# Patient Record
Sex: Female | Born: 1960
Health system: Southern US, Community
[De-identification: ages and names within clinical notes are randomized; demographics above are authoritative.]

## PROBLEM LIST (undated history)

## (undated) DIAGNOSIS — E039 Hypothyroidism, unspecified: Secondary | ICD-10-CM

## (undated) DIAGNOSIS — R0602 Shortness of breath: Secondary | ICD-10-CM

## (undated) HISTORY — DX: Shortness of breath: R06.02

## (undated) HISTORY — DX: Hypothyroidism, unspecified: E03.9

## (undated) HISTORY — PX: ABDOMINAL HYSTERECTOMY: SHX81

## (undated) HISTORY — PX: ECTOPIC PREGNANCY SURGERY: SHX613

---

## 2006-04-19 ENCOUNTER — Encounter: Admission: RE | Admit: 2006-04-19 | Discharge: 2006-04-19 | Payer: Self-pay | Admitting: Obstetrics and Gynecology

## 2006-04-25 ENCOUNTER — Encounter: Admission: RE | Admit: 2006-04-25 | Discharge: 2006-04-25 | Payer: Self-pay | Admitting: Otolaryngology

## 2006-05-13 ENCOUNTER — Encounter (INDEPENDENT_AMBULATORY_CARE_PROVIDER_SITE_OTHER): Payer: Self-pay | Admitting: Specialist

## 2006-05-13 ENCOUNTER — Encounter: Admission: RE | Admit: 2006-05-13 | Discharge: 2006-05-13 | Payer: Self-pay | Admitting: Otolaryngology

## 2006-05-13 ENCOUNTER — Other Ambulatory Visit: Admission: RE | Admit: 2006-05-13 | Discharge: 2006-05-13 | Payer: Self-pay | Admitting: Interventional Radiology

## 2006-07-04 ENCOUNTER — Encounter: Admission: RE | Admit: 2006-07-04 | Discharge: 2006-07-04 | Payer: Self-pay | Admitting: Endocrinology

## 2006-09-18 ENCOUNTER — Ambulatory Visit (HOSPITAL_COMMUNITY): Admission: RE | Admit: 2006-09-18 | Discharge: 2006-09-19 | Payer: Self-pay | Admitting: General Surgery

## 2006-09-18 ENCOUNTER — Encounter (HOSPITAL_BASED_OUTPATIENT_CLINIC_OR_DEPARTMENT_OTHER): Payer: Self-pay | Admitting: General Surgery

## 2008-07-06 IMAGING — US US SOFT TISSUE HEAD/NECK
1 series · 14 of 25 positions shown · non-contrast
Comparison: none

CLINICAL DATA: Left throat mass.  Fullness of the left thyroid. 
 ULTRASOUND SOFT TISSUE HEAD/NECK:
TECHNIQUE: Ultrasound examination of the soft tissues was performed in the area of clinical concern.

[Series 1: unknown · 0.10mm/px · 14 of 39 slices shown]
[im 1/39]
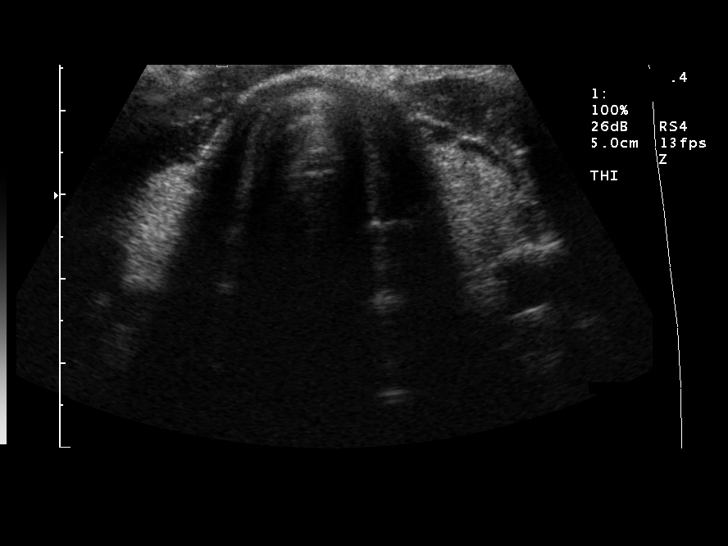
[im 4/39]
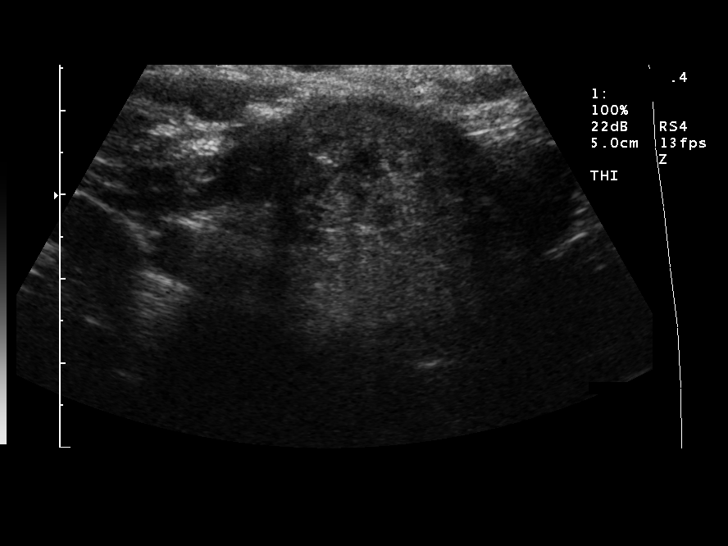
[im 7/39]
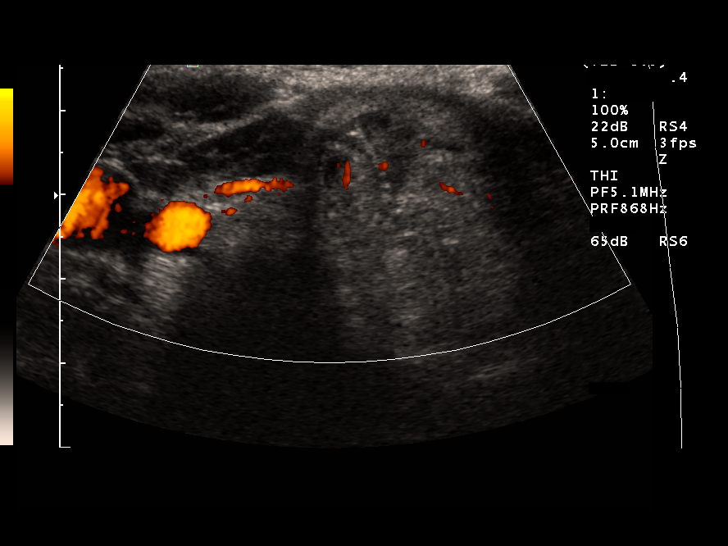
[im 10/39]
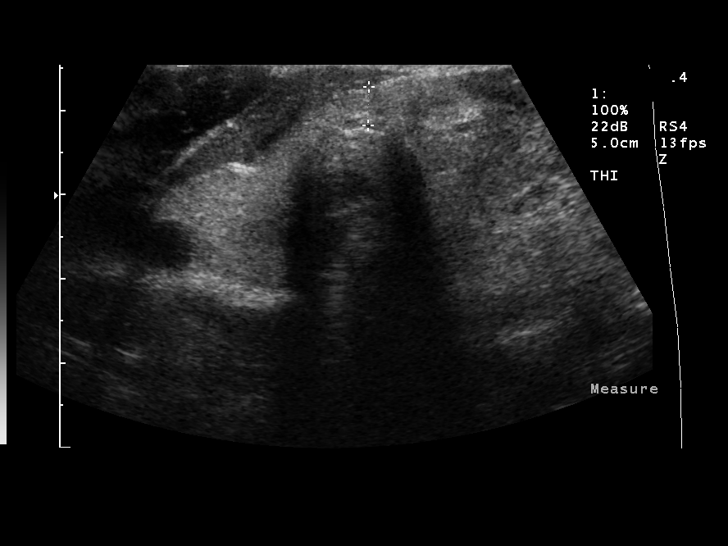
[im 13/39]
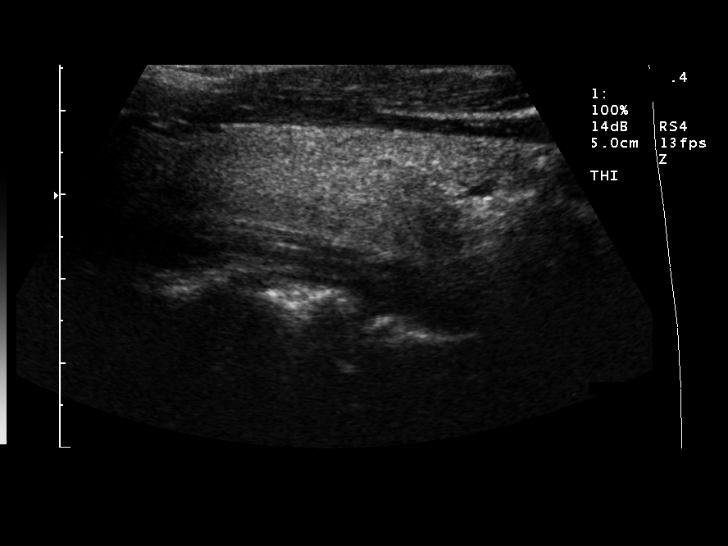
[im 15/39]
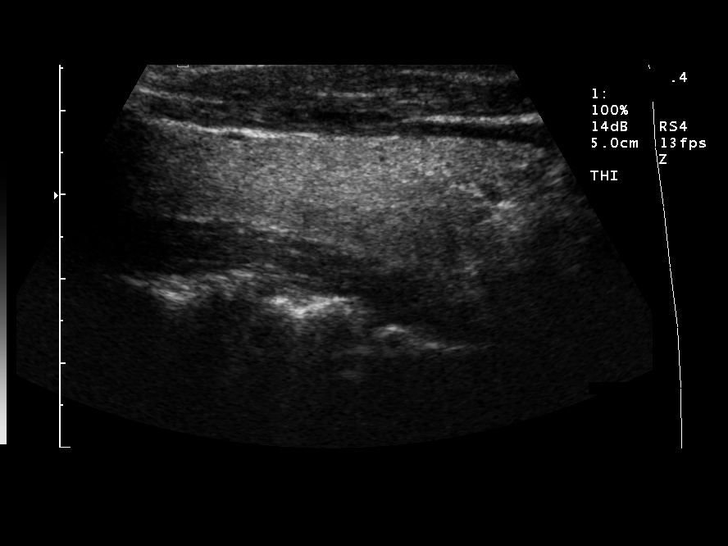
[im 18/39]
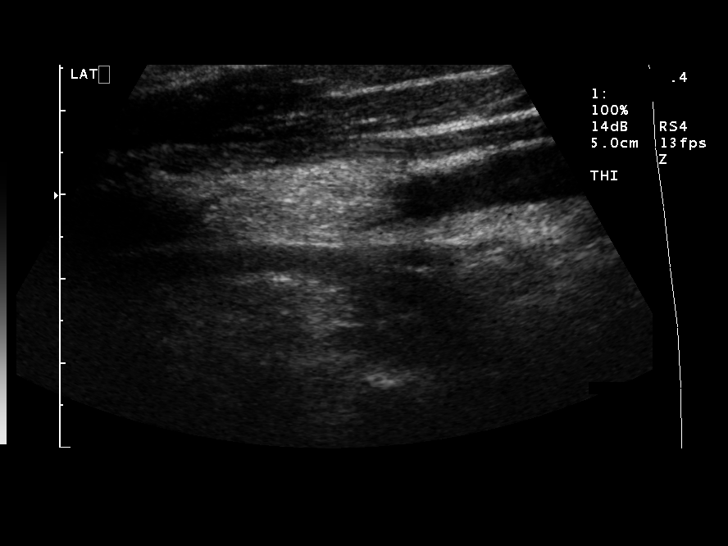
[im 21/39]
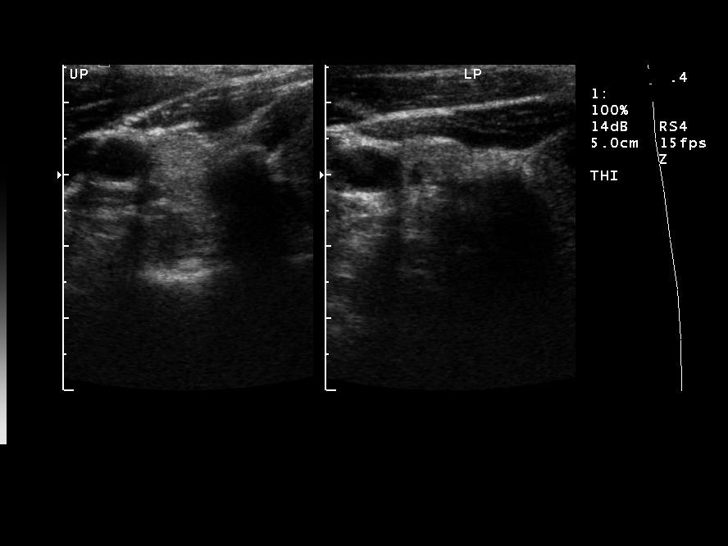
[im 24/39]
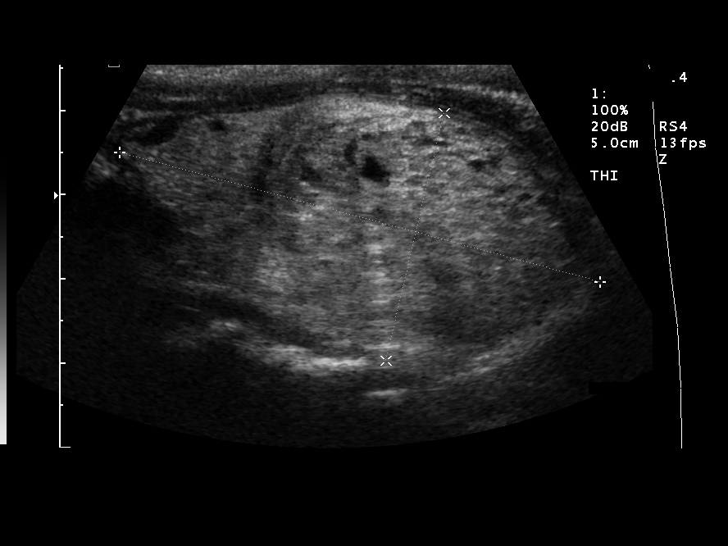
[im 26/39]
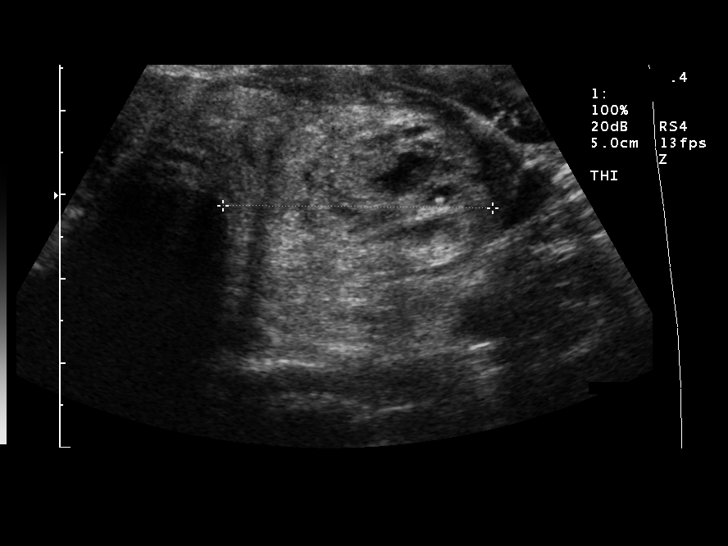
[im 29/39]
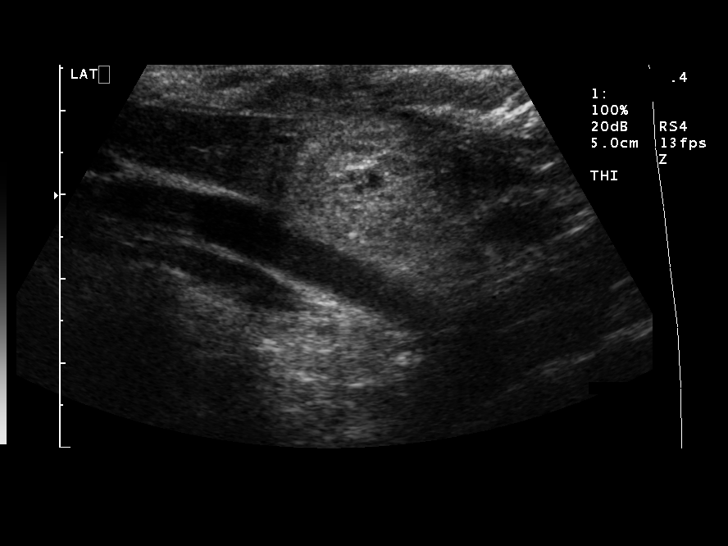
[im 32/39]
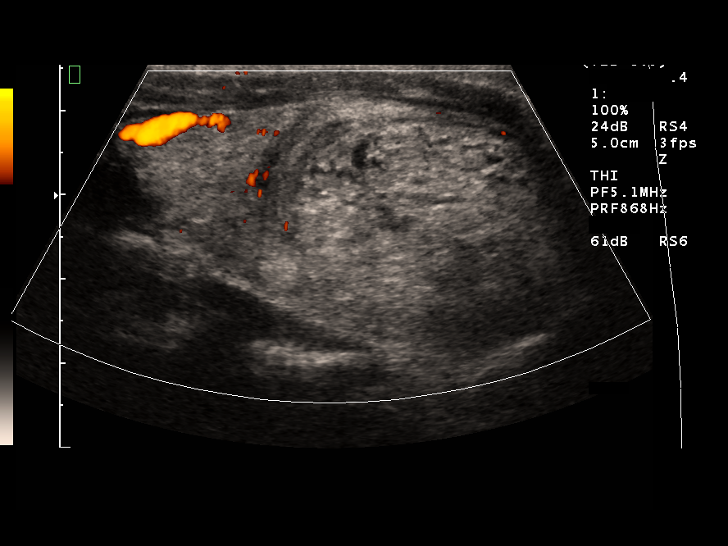
[im 35/39]
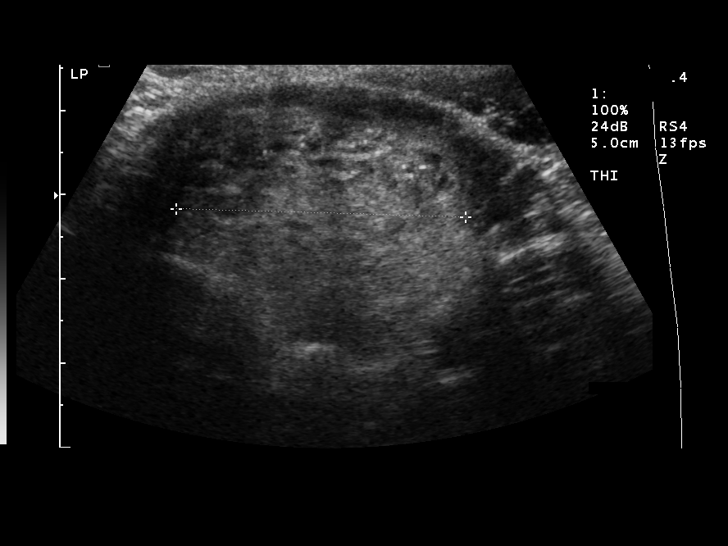
[im 39/39]
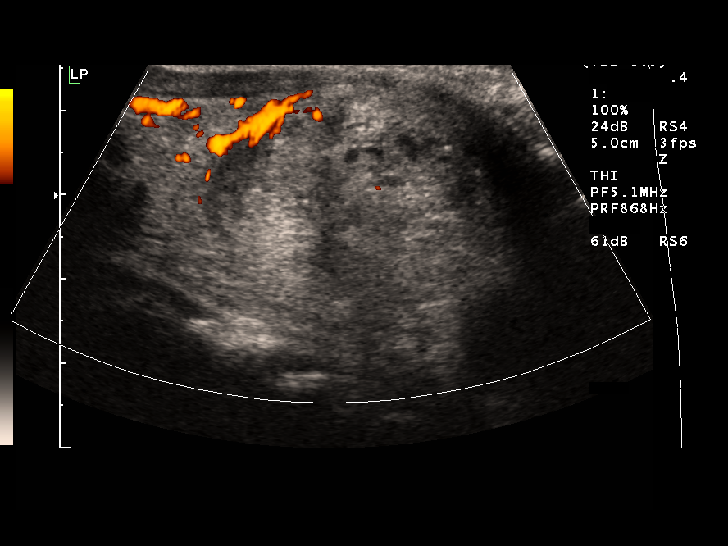

[14 of 25 positions shown; findings below may reference images not displayed]

FINDINGS: The right lobe of thyroid is normal in size measuring 5.2cm sagittally with a depth of 1.7cm and width of 1.8cm.  The left lobe is enlarged primarily as a result of a solid dominant mass.  The left lobe measures 5.9 x 3.0 x 3.2cm with the isthmus measuring 5mm.  This solid dominant mass is in the left mid to lower pole measuring 4.0 x 2.7 x 3.7cm.  No other solid or cystic lesions are noted.  Biopsy of this dominant mass is recommended.
IMPRESSION: Solid dominant mass in the left lobe of thyroid of 4.0 x 2.7 x 3.7cm.  Recommend biopsy.

## 2008-09-03 ENCOUNTER — Ambulatory Visit (HOSPITAL_COMMUNITY): Admission: RE | Admit: 2008-09-03 | Discharge: 2008-09-03 | Payer: Self-pay | Admitting: Gastroenterology

## 2008-10-13 ENCOUNTER — Encounter: Admission: RE | Admit: 2008-10-13 | Discharge: 2008-10-13 | Payer: Self-pay | Admitting: Obstetrics and Gynecology

## 2010-04-23 ENCOUNTER — Encounter: Payer: Self-pay | Admitting: Obstetrics and Gynecology

## 2010-04-24 ENCOUNTER — Encounter: Payer: Self-pay | Admitting: Obstetrics and Gynecology

## 2010-04-24 ENCOUNTER — Encounter: Payer: Self-pay | Admitting: Gastroenterology

## 2010-06-05 ENCOUNTER — Other Ambulatory Visit: Payer: Self-pay | Admitting: Obstetrics and Gynecology

## 2010-06-05 DIAGNOSIS — Z1231 Encounter for screening mammogram for malignant neoplasm of breast: Secondary | ICD-10-CM

## 2010-06-15 ENCOUNTER — Ambulatory Visit: Payer: Self-pay

## 2010-06-23 ENCOUNTER — Inpatient Hospital Stay: Admission: RE | Admit: 2010-06-23 | Payer: Self-pay | Source: Ambulatory Visit

## 2010-06-29 ENCOUNTER — Ambulatory Visit: Payer: Self-pay | Admitting: *Deleted

## 2010-07-04 ENCOUNTER — Ambulatory Visit
Admission: RE | Admit: 2010-07-04 | Discharge: 2010-07-04 | Disposition: A | Discharge status: Home / Self Care | Payer: Commercial Indemnity | Source: Ambulatory Visit | Attending: Obstetrics and Gynecology | Admitting: Obstetrics and Gynecology

## 2010-07-04 DIAGNOSIS — Z1231 Encounter for screening mammogram for malignant neoplasm of breast: Secondary | ICD-10-CM

## 2010-08-15 NOTE — Op Note (Signed)
NAMECURTIS, Pamela Roberts              ACCOUNT NO.:  [id number]   MEDICAL RECORD NO.:  [id number]          PATIENT TYPE:  OIB   LOCATION:  1607                         FACILITY:  [facility]   PHYSICIAN:  Leonie Man, M.D.   DATE OF BIRTH:  June 19, 1960   DATE OF PROCEDURE:  09/18/2006  DATE OF DISCHARGE:                               OPERATIVE REPORT   PREOPERATIVE DIAGNOSIS:  Cold nodule, left thyroid lobe.   POSTOPERATIVE DIAGNOSIS:  Cold nodule, left thyroid lobe, pathology  pending.   PROCEDURES:  Left thyroid lobectomy and isthmusectomy.   SURGEON:  Leonie Man, M.D.   ASSISTANT:  Dr. Lorelee New.   ANESTHESIA:  General.   SPECIMENS TO LAB:  Left thyroid lobe and isthmus.   ESTIMATED BLOOD LOSS:  Minimal.   COMPLICATIONS:  None.  The patient returned to the PACU in excellent  condition.   INDICATIONS:  The patient is a 50 year old female with a nodule of the  left neck.  When she presented originally she underwent fine-needle  aspiration biopsy under ultrasound guidance which showed a 4 cm x 3.7 cm  solid nodule.  This was confirmed on nuclear medicine scan, the  aspiration biopsy shows a follicular lesion with few microfollicles and  some Hurthle cell features.  The patient was referred for thyroid  lobectomy with a possibility of total thyroidectomy if indeed this is a  carcinoma.   The risks and potential benefits of surgery have been fully discussed  with the patient.  These include the risk of hypoparathyroidism as well  as recurrent laryngeal nerve injury.  She understands these risks and  gives her consent to same.   PROCEDURE:  Following induction of satisfactory general anesthesia with  the patient positioned supinely, the neck was prepped and draped to be  included in a sterile operative field.  A collar incision approximately  two fingerbreadths above the sternal notch was carried out through skin  and subcutaneous tissues, deepened through to the  platysma muscle.  A  superior flap was raised up to the thyroid cartilage and inferior flap  carried down to the sternal notch.  The strap muscles are then opened up  in the midline with dissection carried down on the left side, mobilizing  a large left-sided thyroid lobe.  Dissection was carried first up to the  upper pole where the upper pole was dissected free, secured with ties of  2-0 silk and medium clips and then transected using the harmonic focus  scalpel.  The superior parathyroid gland was identified and dissected  free from off of the substance of the thyroid as well as the inferior  pole parathyroid.  Both were clearly seen.  The recurrent laryngeal  nerve was also clearly seen and avoided throughout the entire course of  the dissection.  Dissection was carried down maintaining hemostasis  throughout the entire course of dissection until we crossed the midline.  The thyroid isthmus and pyramidal lobe were also dissected free and the  thyroid isthmus was transected using the Harmonic Focus scalpel.  All  areas dissection were then checked for hemostasis.  The  specimen was  forwarded for pathologic evaluation.  Frozen section diagnosis shows  that this was a follicular lesion with some microfollicular areas  without any areas of atypia noted.  At this point it was decided that we  would end the operation pending permanent pathology.  Again all areas of  dissection were checked for hemostasis.  The neck was irrigated with  normal saline.  I used Gelfoam patches to place over all the areas of  dissection.  Sponge and instrument counts were verified.  The midline  was closed with running 3-0 Vicryl suture.  The platysma muscle and  subcutaneous tissues closed with interrupted 3-0 Vicryl sutures and skin  closed with 5-0 Monocryl suture.  The incision was reinforced with a  single Steri-Strip and then a sterile dressing was applied.  The  anesthetic reversed and the patient removed  from the operating room to  the recovery room in stable condition.  She tolerated the procedure  well.      Leonie Man, M.D.  Electronically Signed     PB/MEDQ  D:  09/18/2006  T:  09/19/2006  Job:  [id number]   cc:   Dorisann Frames, M.D.  Fax: [phone]

## 2011-01-17 LAB — DIFFERENTIAL
Basophils Absolute: 0
Basophils Relative: 0
Eosinophils Absolute: 0.1
Eosinophils Relative: 1
Lymphocytes Relative: 28
Lymphs Abs: 2.5
Monocytes Relative: 9

## 2011-01-17 LAB — COMPREHENSIVE METABOLIC PANEL
ALT: 20
Albumin: 3.5
Alkaline Phosphatase: 73
CO2: 28
CO2: 30
Calcium: 8.5
Calcium: 9.5
Chloride: 102
Chloride: 105
Creatinine, Ser: 0.83
GFR calc Af Amer: >60
GFR calc non Af Amer: >60
Glucose, Bld: 133 — ABNORMAL HIGH
Sodium: 141

## 2011-01-17 LAB — URINALYSIS, ROUTINE W REFLEX MICROSCOPIC
Glucose, UA: NEGATIVE
Ketones, ur: NEGATIVE
Nitrite: NEGATIVE
Protein, ur: NEGATIVE
Urobilinogen, UA: 0.2

## 2011-01-17 LAB — CBC: HCT: 39.2

## 2011-01-17 LAB — PROTIME-INR
INR: 0.9
Prothrombin Time: 12.7

## 2011-04-06 ENCOUNTER — Emergency Department (HOSPITAL_COMMUNITY)
Admission: EM | Admit: 2011-04-06 | Discharge: 2011-04-07 | Disposition: A | Discharge status: Home / Self Care | Payer: Commercial Indemnity | Attending: Emergency Medicine | Admitting: Emergency Medicine

## 2011-04-06 ENCOUNTER — Encounter: Payer: Self-pay | Admitting: Emergency Medicine

## 2011-04-06 ENCOUNTER — Other Ambulatory Visit: Payer: Self-pay

## 2011-04-06 DIAGNOSIS — R0602 Shortness of breath: Secondary | ICD-10-CM | POA: Insufficient documentation

## 2011-04-06 DIAGNOSIS — J189 Pneumonia, unspecified organism: Secondary | ICD-10-CM | POA: Insufficient documentation

## 2011-04-06 DIAGNOSIS — R079 Chest pain, unspecified: Secondary | ICD-10-CM | POA: Insufficient documentation

## 2011-04-06 DIAGNOSIS — R040 Epistaxis: Secondary | ICD-10-CM | POA: Insufficient documentation

## 2011-04-06 NOTE — ED Notes (Signed)
Pt c/o sharpe pain in mid chest  Off and on x's 2 weeks,  St's unable to lay flat  Due to pain in chest and back.  Pt st's her heart races when she walks.  St's at this time she is having pain in mid upper back and chest when she breaths, denies cold symptoms

## 2011-04-07 ENCOUNTER — Emergency Department (HOSPITAL_COMMUNITY): Payer: Commercial Indemnity

## 2011-04-07 LAB — TROPONIN I: Troponin I: <0.3 ng/mL (ref <0.30)

## 2011-04-07 LAB — CBC
HCT: 38 % (ref 36.0–46.0)
RDW: 13.7 % (ref 11.5–15.5)

## 2011-04-07 LAB — DIFFERENTIAL
Basophils Absolute: 0 10*3/uL (ref 0.0–0.1)
Basophils Relative: 0 % (ref 0–1)
Eosinophils Absolute: 0 10*3/uL (ref 0.0–0.7)
Eosinophils Relative: 0 % (ref 0–5)
Lymphs Abs: 2.6 10*3/uL (ref 0.7–4.0)
Neutrophils Relative %: 73 % (ref 43–77)

## 2011-04-07 LAB — BASIC METABOLIC PANEL
Calcium: 9.2 mg/dL (ref 8.4–10.5)
GFR calc Af Amer: >90 mL/min (ref >90)
GFR calc non Af Amer: >90 mL/min (ref >90)
Potassium: 4.1 mEq/L (ref 3.5–5.1)
Sodium: 139 mEq/L (ref 135–145)

## 2011-04-07 LAB — HEPATIC FUNCTION PANEL
Albumin: 3.5 g/dL (ref 3.5–5.2)
Total Protein: 7.7 g/dL (ref 6.0–8.3)

## 2011-04-07 LAB — LIPASE, BLOOD: Lipase: 16 U/L (ref 11–59)

## 2011-04-07 MED ORDER — SODIUM CHLORIDE 0.9 % IV SOLN
INTRAVENOUS | Status: DC
  Administered 2011-04-07: 01:00:00 via INTRAVENOUS

## 2011-04-07 MED ORDER — IOHEXOL 350 MG/ML SOLN
100.0000 mL | Freq: Once | INTRAVENOUS | Status: AC | PRN
  Administered 2011-04-07: 100 mL via INTRAVENOUS

## 2011-04-07 MED ORDER — LEVOFLOXACIN 500 MG PO TABS: 500.0000 mg | ORAL_TABLET | Freq: Every day | ORAL | Status: AC

## 2011-04-07 MED ORDER — HYDROCODONE-ACETAMINOPHEN 5-325 MG PO TABS: 2.0000 | ORAL_TABLET | ORAL | Status: AC | PRN

## 2011-04-07 MED ORDER — FENTANYL CITRATE 0.05 MG/ML IJ SOLN
50.0000 ug | Freq: Once | INTRAMUSCULAR | Status: AC
  Administered 2011-04-07: 50 ug via INTRAVENOUS
  Filled 2011-04-07: qty 2

## 2011-04-07 NOTE — ED Notes (Signed)
Rx x 2, pt voiced understanding to f/u with PCP and return for worsening of condition.

## 2011-04-07 NOTE — ED Provider Notes (Signed)
History     CSN: [id number]  Arrival date & time 04/06/11  1958   First MD Initiated Contact with Patient 04/07/11 0031      Chief Complaint  Patient presents with  . Chest Pain    (Consider location/radiation/quality/duration/timing/severity/associated sxs/prior treatment) Patient is a 51 y.o. female presenting with chest pain. The history is provided by the patient.  Chest Pain Primary symptoms include shortness of breath. Pertinent negatives for primary symptoms include no cough.  Pertinent negatives for associated symptoms include no diaphoresis and no numbness.    patient states she's had chest pain on and off for the last 2 weeks. She states the pain started towards her right shoulder and moved up around over to her chest. She is now without her abdomen also. She states she's had a little bit of blood coming out of her nose. States that her heart races when she walks. She states the pain gets worse when she walks. She states is worse when she lays on her back. No cough. No fevers. No previous history of pains like this. She states the pain gets worse with movement and exertion is better with rest.  History reviewed. No pertinent past medical history.  Past Surgical History  Procedure Date  . Ectopic pregnancy surgery   . Cesarean section w/btl   . Abdominal hysterectomy     No family history on file.  History  Substance Use Topics  . Smoking status: Never Smoker   . Smokeless tobacco: Not on file  . Alcohol Use: No    OB History    Grav Para Term Preterm Abortions TAB SAB Ect Mult Living                  Review of Systems  Constitutional: Negative for diaphoresis.  HENT:       Occasional blood from nose.  Eyes: Negative for pain.  Respiratory: Positive for shortness of breath. Negative for cough.   Cardiovascular: Positive for chest pain. Negative for leg swelling.  Genitourinary: Negative for dysuria and flank pain.  Musculoskeletal: Negative for back pain.   Neurological: Negative for numbness and headaches.  Psychiatric/Behavioral: Negative for confusion.    Allergies  Review of patient's allergies indicates no known allergies.  Home Medications   Current Outpatient Rx  Name Route Sig Dispense Refill  . ASPIRIN EFFERVESCENT 325 MG PO TBEF Oral Take 325 mg by mouth every 6 (six) hours as needed. For cold symptoms     . LEVOTHYROXINE SODIUM 100 MCG PO TABS Oral Take 100 mcg by mouth daily.      Marland Kitchen HYDROCODONE-ACETAMINOPHEN 5-325 MG PO TABS Oral Take 2 tablets by mouth every 4 (four) hours as needed for pain. 10 tablet 0  . LEVOFLOXACIN 500 MG PO TABS Oral Take 1 tablet (500 mg total) by mouth daily. 7 tablet 0    BP 111/75  Pulse 83  Temp(Src) 98.7 F (37.1 C) (Oral)  Resp 24  SpO2 100%  Physical Exam  Nursing note and vitals reviewed. Constitutional: She is oriented to person, place, and time. She appears well-developed and well-nourished.  HENT:  Head: Normocephalic and atraumatic.  Eyes: EOM are normal. Pupils are equal, round, and reactive to light.  Neck: Normal range of motion. Neck supple.  Cardiovascular: Normal rate, regular rhythm and normal heart sounds.   No murmur heard. Pulmonary/Chest: Effort normal and breath sounds normal. No respiratory distress. She has no wheezes. She has no rales.  Abdominal: Soft. Bowel sounds are  normal. She exhibits no distension. There is no tenderness. There is no rebound and no guarding.  Musculoskeletal: Normal range of motion.  Neurological: She is alert and oriented to person, place, and time. No cranial nerve deficit.  Skin: Skin is warm and dry.  Psychiatric: She has a normal mood and affect. Her speech is normal.    ED Course  Procedures (including critical care time)  Labs Reviewed  CBC - Abnormal; Notable for the following:    WBC 13.4 (*)    All other components within normal limits  DIFFERENTIAL - Abnormal; Notable for the following:    Neutro Abs 9.8 (*)    All other  components within normal limits  BASIC METABOLIC PANEL - Abnormal; Notable for the following:    Glucose, Bld 109 (*)    All other components within normal limits  HEPATIC FUNCTION PANEL - Abnormal; Notable for the following:    ALT 39 (*)    All other components within normal limits  TROPONIN I  LIPASE, BLOOD   Dg Chest 2 View  04/07/2011  *RADIOLOGY REPORT*  Clinical Data: Chest pain and shortness of breath.  CHEST - 2 VIEW  Comparison: 09/16/2006  Findings: The cardiomediastinal silhouette is unremarkable. Mild bibasilar atelectasis is identified. There is no evidence of focal airspace disease, pulmonary edema, pulmonary nodule/mass, pleural effusion, or pneumothorax. No acute bony abnormalities are identified.  IMPRESSION: Mild bibasilar atelectasis.  Original Report Authenticated By: Rosendo Gros, M.D.   Ct Angio Chest W/cm &/or Wo Cm  04/07/2011  *RADIOLOGY REPORT*  Clinical Data:  Lambert Mody mid chest pain  CT ANGIOGRAPHY CHEST WITH CONTRAST  Technique:  Multidetector CT imaging of the chest was performed using the standard protocol during bolus administration of intravenous contrast.  Multiplanar CT image reconstructions including MIPs were obtained to evaluate the vascular anatomy.  Contrast: OMNIPAQUE IOHEXOL 350 MG/ML IV SOLN  Comparison:  04/07/2011 radiograph  Findings:  Vasculature:  Normal caliber aorta and great vessels.  No aneurysmal dilatation or dissection. The celiac axis, SMA, and IMA are patent.  Bilateral single renal arteries are patent.  The iliac vasculature is normal in caliber, patent.  Chest:  Trace pericardial fluid. Mild right lower lobe consolidation. Mild areas of air trapping.  No pneumothorax. Central airways are patent. No pleural effusion.  No intrathoracic lymphadenopathy. Tiny hiatal hernia.  Multilevel degenerative changes.  No acute osseous abnormality identified.  Abdomen pelvis:  Unremarkable liver, biliary system, spleen, pancreas, adrenal glands.  Symmetric  renal enhancement with the exception of a simple cyst arising posteriorly from the right kidney.  No bowel obstruction.  No CT evidence for colitis.  No free intraperitoneal air or fluid.  No lymphadenopathy.  Appendix within normal limits.  Uterus not identified. 2.9 cm right adnexal cyst.  No free fluid within the pelvis.  Adnexa otherwise within normal limits.  Small fat containing right periumbilical hernia.  Multilevel degenerative changes.  No acute osseous abnormality identified.  Review of the MIP images confirms the above findings.  IMPRESSION:  Normal caliber aorta.  No aneurysmal dilatation or dissection.  Trace pericardial fluid.  Right lower lobe consolidation; atelectasis versus infiltrate.  No acute abnormality within the abdomen pelvis.  2.9 cm right adnexal cyst, likely incidental.  Could be followed up/better characterized with a non emergent pelvic ultrasound.  Original Report Authenticated By: Waneta Martins, M.D.   Ct Angio Abd/pel W/ And/or W/o  04/07/2011  *RADIOLOGY REPORT*  Clinical Data:  Lambert Mody mid chest pain  CT ANGIOGRAPHY CHEST WITH CONTRAST  Technique:  Multidetector CT imaging of the chest was performed using the standard protocol during bolus administration of intravenous contrast.  Multiplanar CT image reconstructions including MIPs were obtained to evaluate the vascular anatomy.  Contrast: OMNIPAQUE IOHEXOL 350 MG/ML IV SOLN  Comparison:  04/07/2011 radiograph  Findings:  Vasculature:  Normal caliber aorta and great vessels.  No aneurysmal dilatation or dissection. The celiac axis, SMA, and IMA are patent.  Bilateral single renal arteries are patent.  The iliac vasculature is normal in caliber, patent.  Chest:  Trace pericardial fluid. Mild right lower lobe consolidation. Mild areas of air trapping.  No pneumothorax. Central airways are patent. No pleural effusion.  No intrathoracic lymphadenopathy. Tiny hiatal hernia.  Multilevel degenerative changes.  No acute osseous  abnormality identified.  Abdomen pelvis:  Unremarkable liver, biliary system, spleen, pancreas, adrenal glands.  Symmetric renal enhancement with the exception of a simple cyst arising posteriorly from the right kidney.  No bowel obstruction.  No CT evidence for colitis.  No free intraperitoneal air or fluid.  No lymphadenopathy.  Appendix within normal limits.  Uterus not identified. 2.9 cm right adnexal cyst.  No free fluid within the pelvis.  Adnexa otherwise within normal limits.  Small fat containing right periumbilical hernia.  Multilevel degenerative changes.  No acute osseous abnormality identified.  Review of the MIP images confirms the above findings.  IMPRESSION:  Normal caliber aorta.  No aneurysmal dilatation or dissection.  Trace pericardial fluid.  Right lower lobe consolidation; atelectasis versus infiltrate.  No acute abnormality within the abdomen pelvis.  2.9 cm right adnexal cyst, likely incidental.  Could be followed up/better characterized with a non emergent pelvic ultrasound.  Original Report Authenticated By: Waneta Martins, M.D.     1. Pneumonia   2. Chest pain      Date: 04/07/2011  Rate: 98  Rhythm: normal sinus rhythm  QRS Axis: normal  Intervals: normal  ST/T Wave abnormalities: normal  Conduction Disutrbances:none  Narrative Interpretation:   Old EKG Reviewed: unchanged     MDM  Patient's had just been on off for 2 weeks. Worse with lying back or walking. Radiates to back. CT angiogram negative for dissection. Possible pneumonia on the CTA. White count is up a little bit I'll treat this as a pneumonia. She'll followup with the primary care Dr.        Juliet Rude. Rubin Payor, MD 04/07/11 6213

## 2011-04-26 ENCOUNTER — Institutional Professional Consult (permissible substitution): Payer: Commercial Indemnity | Admitting: Cardiology

## 2011-07-30 ENCOUNTER — Encounter: Payer: Self-pay | Admitting: *Deleted

## 2011-07-31 ENCOUNTER — Encounter: Payer: Self-pay | Admitting: Internal Medicine

## 2011-07-31 ENCOUNTER — Ambulatory Visit (INDEPENDENT_AMBULATORY_CARE_PROVIDER_SITE_OTHER): Payer: BC Managed Care – PPO | Admitting: Internal Medicine

## 2011-07-31 VITALS — BP 104/62 | HR 74 | Temp 98.3°F | Ht 60.0 in | Wt 214.2 lb

## 2011-07-31 DIAGNOSIS — R0609 Other forms of dyspnea: Secondary | ICD-10-CM

## 2011-07-31 DIAGNOSIS — R0600 Dyspnea, unspecified: Secondary | ICD-10-CM

## 2011-07-31 DIAGNOSIS — R0989 Other specified symptoms and signs involving the circulatory and respiratory systems: Secondary | ICD-10-CM

## 2011-07-31 MED ORDER — FAMOTIDINE 20 MG PO TABS: ORAL_TABLET | ORAL | Status: DC

## 2011-07-31 NOTE — Patient Instructions (Signed)
Pepcid 20 mg one at bedtime  GERD (REFLUX)  is an extremely common cause of respiratory symptoms, many times with no significant heartburn at all.    It can be treated with medication, but also with lifestyle changes including avoidance of late meals, excessive alcohol, smoking cessation, and avoid fatty foods, chocolate, peppermint, colas, red wine, and acidic juices such as orange juice.  NO MINT OR MENTHOL PRODUCTS SO NO COUGH DROPS  USE SUGARLESS CANDY INSTEAD (jolley ranchers or Stover's)  NO OIL BASED VITAMINS - use powdered substitutes.   If you have another spell call for repeat CT Chest

## 2011-07-31 NOTE — Progress Notes (Signed)
  Subjective:    Patient ID: Pamela Roberts, female    DOB: Sep 30, 1960   MRN: [id number]  HPI  72  yobf never smoker morbidly obese baseline wt 170- 189 with hypothroidism with no tendency to asthma/ allergy with spells of sob, midline cp, heart palpitations referred by Dr Frankey Poot 07/31/2011 for pulmonary evaluation.   07/31/2011 pulmonary eval/ Ryett Hamman cc 3 spells of breathing difficulty lasting about a week each overall trending toward improvement since first spell, sometimes with dry cough, never high fever,  Last spell was 07/06/11  rx with levaquin - feels 100% in between spells, tolerating  lots of walking, whole flight of steps.    Sleeping ok without nocturnal  or early am exacerbation  of respiratory  c/o's or need for noct saba. Also denies any obvious fluctuation of symptoms with weather or environmental changes or other aggravating or alleviating factors except as outlined above    Review of Systems  Constitutional: Negative for fever, chills and unexpected weight change.  HENT: Negative for ear pain, nosebleeds, congestion, sore throat, rhinorrhea, sneezing, trouble swallowing, dental problem, voice change, postnasal drip and sinus pressure.   Eyes: Negative for visual disturbance.  Respiratory: Negative for cough, choking and shortness of breath.   Cardiovascular: Negative for chest pain and leg swelling.  Gastrointestinal: Negative for vomiting, abdominal pain and diarrhea.  Genitourinary: Negative for difficulty urinating.  Musculoskeletal: Negative for arthralgias.  Skin: Negative for rash.  Neurological: Negative for tremors, syncope and headaches.  Hematological: Does not bruise/bleed easily.       Objective:   Physical Exam amb obese bf nad who   failed to answer a single question asked in a straightforward manner, tending to go off on tangents or answer questions with ambiguous medical terms or diagnoses and seemed perplexed  when asked the same question more than once  for clarification.  Wt 214  07/31/11 HEENT: nl dentition, turbinates, and orophanx. Nl external ear canals without cough reflex   NECK :  without JVD/Nodes/TM/ nl carotid upstrokes bilaterally   LUNGS: no acc muscle use, clear to A and P bilaterally without cough on insp or exp maneuvers   CV:  RRR  no s3 or murmur or increase in P2, no edema   ABD:  soft and nontender with nl excursion in the supine position. No bruits or organomegaly, bowel sounds nl  MS:  warm without deformities, calf tenderness, cyanosis or clubbing  SKIN: warm and dry without lesions    NEURO:  alert, approp, no deficits   Ct chest 04/17/11 Chest: Trace pericardial fluid. Mild right lower lobe  consolidation. Mild areas of air trapping. No pneumothorax.  Central airways are patent. No pleural effusion. No intrathoracic  lymphadenopathy. Tiny hiatal hernia.       Assessment & Plan:

## 2011-08-02 DIAGNOSIS — R0600 Dyspnea, unspecified: Secondary | ICD-10-CM | POA: Insufficient documentation

## 2011-08-02 NOTE — Assessment & Plan Note (Signed)
Symptoms are markedly disproportionate to objective findings and not clear this is a lung problem but pt does appear to have difficult airway management issues. DDX of  difficult airways managment all start with A and  include Adherence, Ace Inhibitors, Acid Reflux, Active Sinus Disease, Alpha 1 Antitripsin deficiency, Anxiety masquerading as Airways dz,  ABPA,  allergy(esp in young), Aspiration (esp in elderly), Adverse effects of DPI,  Active smokers, plus two Bs  = Bronchiectasis and Beta blocker use..and one C= CHF  Acid reflux would be the leading suspect here based on body habitus assoc with a form of    Upper airway cough syndrome, so named because it's frequently impossible to sort out how much is  CR/sinusitis with freq throat clearing (which can be related to primary GERD)   vs  causing  secondary (" extra esophageal")  GERD from wide swings in gastric pressure that occur with throat clearing, often  promoting self use of mint and menthol lozenges that reduce the lower esophageal sphincter tone and exacerbate the problem further in a cyclical fashion.   These are the same pts (now being labeled as having "irritable larynx syndrome" by some cough centers) who not infrequently have a history of having failed to tolerate ace inhibitors,  dry powder inhalers or biphosphonates or report having atypical reflux symptoms that don't respond to standard doses of PPI , and are easily confused as having aecopd or asthma flares by even experienced allergists/ pulmonologists.   For now rx as gerd with pepcid at bedtime and diet/ reviewed, and repeat ct/pfts if spells recur on gerd rx

## 2012-02-08 ENCOUNTER — Other Ambulatory Visit: Payer: Self-pay | Admitting: Physician Assistant

## 2012-02-08 DIAGNOSIS — N63 Unspecified lump in unspecified breast: Secondary | ICD-10-CM

## 2012-02-15 ENCOUNTER — Ambulatory Visit
Admission: RE | Admit: 2012-02-15 | Discharge: 2012-02-15 | Disposition: A | Discharge status: Home / Self Care | Payer: BC Managed Care – PPO | Source: Ambulatory Visit | Attending: Physician Assistant | Admitting: Physician Assistant

## 2012-02-15 ENCOUNTER — Other Ambulatory Visit: Payer: BC Managed Care – PPO

## 2012-02-15 DIAGNOSIS — N63 Unspecified lump in unspecified breast: Secondary | ICD-10-CM

## 2013-05-27 ENCOUNTER — Emergency Department (INDEPENDENT_AMBULATORY_CARE_PROVIDER_SITE_OTHER): Payer: 59

## 2013-05-27 ENCOUNTER — Emergency Department (INDEPENDENT_AMBULATORY_CARE_PROVIDER_SITE_OTHER)
Admission: EM | Admit: 2013-05-27 | Discharge: 2013-05-27 | Disposition: A | Discharge status: Home / Self Care | Payer: 59 | Source: Home / Self Care | Attending: Family Medicine | Admitting: Family Medicine

## 2013-05-27 ENCOUNTER — Encounter (HOSPITAL_COMMUNITY): Payer: Self-pay | Admitting: Emergency Medicine

## 2013-05-27 DIAGNOSIS — K2990 Gastroduodenitis, unspecified, without bleeding: Secondary | ICD-10-CM

## 2013-05-27 DIAGNOSIS — K2970 Gastritis, unspecified, without bleeding: Secondary | ICD-10-CM

## 2013-05-27 DIAGNOSIS — R079 Chest pain, unspecified: Secondary | ICD-10-CM

## 2013-05-27 MED ORDER — OMEPRAZOLE 40 MG PO CPDR: 40.0000 mg | DELAYED_RELEASE_CAPSULE | Freq: Two times a day (BID) | ORAL | Status: AC

## 2013-05-27 MED ORDER — GI COCKTAIL ~~LOC~~
30.0000 mL | Freq: Once | ORAL | Status: AC
  Administered 2013-05-27: 30 mL via ORAL

## 2013-05-27 MED ORDER — GI COCKTAIL ~~LOC~~
ORAL | Status: AC
  Filled 2013-05-27: qty 30

## 2013-05-27 NOTE — ED Notes (Signed)
C/o chest pain which started this morning States no radiation Pain is mid chest area Does have cough

## 2013-05-27 NOTE — ED Provider Notes (Signed)
Pamela ConnorsShirley Roberts is a 53 y.o. female who presents to Urgent Care today for chest pain. Patient has central chest pain that was present this morning. It did not wake her from sleep. The pain is nonradiating. The pain is worse with deep breathing and coughing. No exertional component. No fevers chills nausea vomiting diarrhea. No syncope. Patient has not tried any medications yet. She feels well otherwise.   Past Medical History  Diagnosis Date  . Hypothyroidism   . SOB (shortness of breath)    History  Substance Use Topics  . Smoking status: Never Smoker   . Smokeless tobacco: Never Used  . Alcohol Use: No   ROS as above Medications: No current facility-administered medications for this encounter.   Current Outpatient Prescriptions  Medication Sig Dispense Refill  . levothyroxine (SYNTHROID, LEVOTHROID) 100 MCG tablet Take 100 mcg by mouth daily.        Marland Kitchen. omeprazole (PRILOSEC) 40 MG capsule Take 1 capsule (40 mg total) by mouth 2 (two) times daily.  60 capsule  1  . [DISCONTINUED] famotidine (PEPCID) 20 MG tablet One at bedtime  30 tablet  11    Exam:  BP 130/85  Pulse 90  Temp(Src) 98.2 F (36.8 C) (Oral)  Resp 18  SpO2 98% Gen: Well NAD obese HEENT: EOMI,  MMM Lungs: Normal work of breathing. CTABL Chest wall: Tender to palpation mid chest wall Heart: RRR no MRG Abd: NABS, Soft. NT, ND Exts: Brisk capillary refill, warm and well perfused.   Patient was given a GI cocktail and had considerable improvement in symptoms  Twelve-lead EKG shows normal sinus rhythm at 70 beats per minutes. Normal QRS and ST segments. On the one minute rhythm strip patient had one episode of 1 premature supraventricular complex. Otherwise normal. EKG unchanged from prior.  No results found for this or any previous visit (from the past 24 hour(s)). Dg Chest 2 View  05/27/2013   CLINICAL DATA:  Chest pain.  EXAM: CHEST  2 VIEW  COMPARISON:  CT chest 04/07/2011 and PA and lateral chest 04/07/2011.   FINDINGS: Heart size and mediastinal contours are within normal limits. Both lungs are clear. Visualized skeletal structures are unremarkable.  IMPRESSION: Negative exam.   Electronically Signed   By: Drusilla Kannerhomas  Dalessio M.D.   On: 05/27/2013 15:00    Assessment and Plan: 53 y.o. female with atypical/nonanginal chest pain. This is possibly pleuritis versus gastritis or esophagitis. Patient had considerable improvement with GI cocktail which is reassuring. Additionally her EKG is unchanged from prior EKGs.  Discussed risks and benefits with patient. Feel is reasonable to discharge home with PPI for gastritis symptoms. Recommend prompt followup with cardiology for stress exam. Carefully reviewed per cautions that would prompt return to the emergency room.  Discussed warning signs or symptoms. Please see discharge instructions. Patient expresses understanding.    Rodolph BongEvan S Jan Olano, MD 05/27/13 [phone]

## 2013-05-27 NOTE — Discharge Instructions (Signed)
Thank you for coming in today. Take omeprazole twice daily Cardiology should call you seem to schedule an appointment.  Call or go to the emergency room if you get worse, have trouble breathing, have chest pains, or palpitations.   Chest Pain (Nonspecific) It is often hard to give a specific diagnosis for the cause of chest pain. There is always a chance that your pain could be related to something serious, such as a heart attack or a blood clot in the lungs. You need to follow up with your caregiver for further evaluation. CAUSES   Heartburn.  Pneumonia or bronchitis.  Anxiety or stress.  Inflammation around your heart (pericarditis) or lung (pleuritis or pleurisy).  A blood clot in the lung.  A collapsed lung (pneumothorax). It can develop suddenly on its own (spontaneous pneumothorax) or from injury (trauma) to the chest.  Shingles infection (herpes zoster virus). The chest wall is composed of bones, muscles, and cartilage. Any of these can be the source of the pain.  The bones can be bruised by injury.  The muscles or cartilage can be strained by coughing or overwork.  The cartilage can be affected by inflammation and become sore (costochondritis). DIAGNOSIS  Lab tests or other studies, such as X-rays, electrocardiography, stress testing, or cardiac imaging, may be needed to find the cause of your pain.  TREATMENT   Treatment depends on what may be causing your chest pain. Treatment may include:  Acid blockers for heartburn.  Anti-inflammatory medicine.  Pain medicine for inflammatory conditions.  Antibiotics if an infection is present.  You may be advised to change lifestyle habits. This includes stopping smoking and avoiding alcohol, caffeine, and chocolate.  You may be advised to keep your head raised (elevated) when sleeping. This reduces the chance of acid going backward from your stomach into your esophagus.  Most of the time, nonspecific chest pain will  improve within 2 to 3 days with rest and mild pain medicine. HOME CARE INSTRUCTIONS   If antibiotics were prescribed, take your antibiotics as directed. Finish them even if you start to feel better.  For the next few days, avoid physical activities that bring on chest pain. Continue physical activities as directed.  Do not smoke.  Avoid drinking alcohol.  Only take over-the-counter or prescription medicine for pain, discomfort, or fever as directed by your caregiver.  Follow your caregiver's suggestions for further testing if your chest pain does not go away.  Keep any follow-up appointments you made. If you do not go to an appointment, you could develop lasting (chronic) problems with pain. If there is any problem keeping an appointment, you must call to reschedule. SEEK MEDICAL CARE IF:   You think you are having problems from the medicine you are taking. Read your medicine instructions carefully.  Your chest pain does not go away, even after treatment.  You develop a rash with blisters on your chest. SEEK IMMEDIATE MEDICAL CARE IF:   You have increased chest pain or pain that spreads to your arm, neck, jaw, back, or abdomen.  You develop shortness of breath, an increasing cough, or you are coughing up blood.  You have severe back or abdominal pain, feel nauseous, or vomit.  You develop severe weakness, fainting, or chills.  You have a fever. THIS IS AN EMERGENCY. Do not wait to see if the pain will go away. Get medical help at once. Call your local emergency services (911 in [place] Do not drive yourself to the hospital. MAKE  SURE YOU:   Understand these instructions.  Will watch your condition.  Will get help right away if you are not doing well or get worse. Document Released: 12/27/2004 Document Revised: 06/11/2011 Document Reviewed: 10/23/2007 [facility] Patient Information 2014 [place], [place]. Gastritis, Adult Gastritis is soreness and swelling (inflammation) of the  lining of the stomach. Gastritis can develop as a sudden onset (acute) or long-term (chronic) condition. If gastritis is not treated, it can lead to stomach bleeding and ulcers. CAUSES  Gastritis occurs when the stomach lining is weak or damaged. Digestive juices from the stomach then inflame the weakened stomach lining. The stomach lining may be weak or damaged due to viral or bacterial infections. One common bacterial infection is the Helicobacter pylori infection. Gastritis can also result from excessive alcohol consumption, taking certain medicines, or having too much acid in the stomach.  SYMPTOMS  In some cases, there are no symptoms. When symptoms are present, they may include:  Pain or a burning sensation in the upper abdomen.  Nausea.  Vomiting.  An uncomfortable feeling of fullness after eating. DIAGNOSIS  Your caregiver may suspect you have gastritis based on your symptoms and a physical exam. To determine the cause of your gastritis, your caregiver may perform the following:  Blood or stool tests to check for the H pylori bacterium.  Gastroscopy. A thin, flexible tube (endoscope) is passed down the esophagus and into the stomach. The endoscope has a light and camera on the end. Your caregiver uses the endoscope to view the inside of the stomach.  Taking a tissue sample (biopsy) from the stomach to examine under a microscope. TREATMENT  Depending on the cause of your gastritis, medicines may be prescribed. If you have a bacterial infection, such as an H pylori infection, antibiotics may be given. If your gastritis is caused by too much acid in the stomach, H2 blockers or antacids may be given. Your caregiver may recommend that you stop taking aspirin, ibuprofen, or other nonsteroidal anti-inflammatory drugs (NSAIDs). HOME CARE INSTRUCTIONS  Only take over-the-counter or prescription medicines as directed by your caregiver.  If you were given antibiotic medicines, take them as  directed. Finish them even if you start to feel better.  Drink enough fluids to keep your urine clear or pale yellow.  Avoid foods and drinks that make your symptoms worse, such as:  Caffeine or alcoholic drinks.  Chocolate.  Peppermint or mint flavorings.  Garlic and onions.  Spicy foods.  Citrus fruits, such as oranges, lemons, or limes.  Tomato-based foods such as sauce, chili, salsa, and pizza.  Fried and fatty foods.  Eat small, frequent meals instead of large meals. SEEK IMMEDIATE MEDICAL CARE IF:   You have black or dark red stools.  You vomit blood or material that looks like coffee grounds.  You are unable to keep fluids down.  Your abdominal pain gets worse.  You have a fever.  You do not feel better after 1 week.  You have any other questions or concerns. MAKE SURE YOU:  Understand these instructions.  Will watch your condition.  Will get help right away if you are not doing well or get worse. Document Released: 03/13/2001 Document Revised: 09/18/2011 Document Reviewed: 05/02/2011 [facility] Patient Information 2014 [place], [place].

## 2013-06-19 ENCOUNTER — Institutional Professional Consult (permissible substitution): Payer: 59 | Admitting: Internal Medicine

## 2013-07-29 ENCOUNTER — Emergency Department (HOSPITAL_COMMUNITY)
Admission: EM | Admit: 2013-07-29 | Discharge: 2013-07-29 | Disposition: A | Discharge status: Home / Self Care | Payer: 59 | Source: Home / Self Care | Attending: Family Medicine | Admitting: Family Medicine

## 2013-07-29 ENCOUNTER — Encounter (HOSPITAL_COMMUNITY): Payer: Self-pay | Admitting: Emergency Medicine

## 2013-07-29 DIAGNOSIS — J069 Acute upper respiratory infection, unspecified: Secondary | ICD-10-CM

## 2013-07-29 MED ORDER — IPRATROPIUM BROMIDE 0.06 % NA SOLN: 2.0000 | Freq: Four times a day (QID) | NASAL | Status: DC

## 2013-07-29 NOTE — ED Provider Notes (Signed)
Medical screening examination/treatment/procedure(s) were performed by a resident physician or non-physician practitioner and as the supervising physician I was immediately available for consultation/collaboration.  Mekiah Wahler, MD    Armin Yerger S Foy Vanduyne, MD 07/29/13 2140 

## 2013-07-29 NOTE — ED Provider Notes (Signed)
CSN: [id number]     Arrival date & time 07/29/13  1005 History   First MD Initiated Contact with Patient 07/29/13 1027     Chief Complaint  Patient presents with  . URI   (Consider location/radiation/quality/duration/timing/severity/associated sxs/prior Treatment) Patient is a 53 y.o. female presenting with URI. The history is provided by the patient.  URI Presenting symptoms: congestion, cough, rhinorrhea and sore throat   Presenting symptoms comment:  +sneezing, scratchy throat Severity:  Moderate Onset quality:  Gradual Duration:  4 days Progression:  Unchanged Chronicity:  New Associated symptoms: sneezing   Associated symptoms: no arthralgias, no headaches, no myalgias, no neck pain, no sinus pain, no swollen glands and no wheezing   Risk factors: recent travel     Past Medical History  Diagnosis Date  . Hypothyroidism   . SOB (shortness of breath)    Past Surgical History  Procedure Laterality Date  . Ectopic pregnancy surgery    . Cesarean section w/btl    . Abdominal hysterectomy     Family History  Problem Relation Age of Onset  . Asthma Brother   . Heart disease Mother   . Lung cancer Father     was a smoker  . Diabetes Brother   . Hypertension Mother    History  Substance Use Topics  . Smoking status: Never Smoker   . Smokeless tobacco: Never Used  . Alcohol Use: No   OB History   Grav Para Term Preterm Abortions TAB SAB Ect Mult Living                 Review of Systems  HENT: Positive for congestion, rhinorrhea, sneezing and sore throat.   Respiratory: Positive for cough. Negative for wheezing.   Musculoskeletal: Negative for arthralgias, myalgias and neck pain.  Neurological: Negative for headaches.  All other systems reviewed and are negative.   Allergies  Review of patient's allergies indicates no known allergies.  Home Medications   Prior to Admission medications   Medication Sig Start Date End Date Taking? Authorizing Provider   levothyroxine (SYNTHROID, LEVOTHROID) 100 MCG tablet Take 100 mcg by mouth daily.      Historical Provider, MD  omeprazole (PRILOSEC) 40 MG capsule Take 1 capsule (40 mg total) by mouth 2 (two) times daily. 05/27/13   Rodolph BongEvan S Corey, MD   BP 112/70  Pulse 79  Temp(Src) 98.3 F (36.8 C) (Oral)  Resp 18  SpO2 97% Physical Exam  Nursing note and vitals reviewed. Constitutional: She is oriented to person, place, and time. She appears well-developed and well-nourished. No distress.  +obese  HENT:  Head: Normocephalic and atraumatic.  Right Ear: Hearing, tympanic membrane, external ear and ear canal normal.  Left Ear: Hearing, tympanic membrane, external ear and ear canal normal.  Nose: Nose normal.  Mouth/Throat: Uvula is midline, oropharynx is clear and moist and mucous membranes are normal. No oral lesions. No trismus in the jaw. No uvula swelling.  Eyes: Conjunctivae are normal. No scleral icterus.  Neck: Normal range of motion. Neck supple.  Cardiovascular: Normal rate, regular rhythm and normal heart sounds.   Pulmonary/Chest: Effort normal and breath sounds normal. No respiratory distress. She has no wheezes.  Musculoskeletal: Normal range of motion.  Lymphadenopathy:    She has no cervical adenopathy.  Neurological: She is alert and oriented to person, place, and time.  Skin: Skin is warm and dry. No rash noted.  Psychiatric: She has a normal mood and affect. Her behavior is normal.  ED Course  Procedures (including critical care time) Labs Review Labs Reviewed - No data to display  Imaging Review No results found.   MDM   1. URI (upper respiratory infection)   Symptomatic care at home. Atrovent nasal sprat for congestion.     Jess BartersJennifer Lee PawletPresson, [place] 07/29/13 1048

## 2013-07-29 NOTE — ED Notes (Signed)
Recent trip by train , felt chilled on trip, thinks she got sick due to chill. C/o increased nasal secretions, coughs until gags. NAD at present

## 2013-07-29 NOTE — Discharge Instructions (Signed)

## 2014-03-02 ENCOUNTER — Ambulatory Visit (INDEPENDENT_AMBULATORY_CARE_PROVIDER_SITE_OTHER): Payer: 59 | Admitting: Internal Medicine

## 2014-03-02 VITALS — BP 118/70 | HR 79 | Temp 98.2°F | Resp 16 | Ht 61.0 in | Wt 204.0 lb

## 2014-03-02 DIAGNOSIS — K047 Periapical abscess without sinus: Secondary | ICD-10-CM

## 2014-03-02 MED ORDER — AMOXICILLIN 500 MG PO CAPS: 1000.0000 mg | ORAL_CAPSULE | Freq: Two times a day (BID) | ORAL | Status: AC

## 2014-03-02 NOTE — Progress Notes (Signed)
   Subjective:  This chart was scribed for Tonye Pearsonobert P Maxum Cassarino, MD by Charline BillsEssence Howell, ED Scribe. The patient was seen in room 9. Patient's care was started at 5:52 PM.   Patient ID: Pamela ConnorsShirley Roberts, female    DOB: 07/03/1960, 53 y.o.   MRN: [id number]  Chief Complaint  Patient presents with  . Dental Pain    Upper right, X 3 weeks   HPI HPI Comments: Pamela Roberts is a 53 y.o. female who presents to the [facility] complaining of gradually improving R upper dental pain onset 3 weeks ago. Pt reports associated gum swelling that has improved and facial swelling that has resolved. She suspects that she has a dental abscess and reports drainage from the area last week. Pt has contacted 2 different dentists and can not be seen until the middle of January. No known allergies.   Past Medical History  Diagnosis Date  . Hypothyroidism   . SOB (shortness of breath)    Current Outpatient Prescriptions on File Prior to Visit  Medication Sig Dispense Refill  . levothyroxine (SYNTHROID, LEVOTHROID) 100 MCG tablet Take 100 mcg by mouth daily.      Marland Kitchen. ipratropium (ATROVENT) 0.06 % nasal spray Place 2 sprays into both nostrils 4 (four) times daily. For nasal congestion (Patient not taking: Reported on 03/02/2014) 15 mL 0  . omeprazole (PRILOSEC) 40 MG capsule Take 1 capsule (40 mg total) by mouth 2 (two) times daily. (Patient not taking: Reported on 03/02/2014) 60 capsule 1  . [DISCONTINUED] famotidine (PEPCID) 20 MG tablet One at bedtime 30 tablet 11   No current facility-administered medications on file prior to visit.   No Known Allergies  Review of Systems  Constitutional: Negative for fever, chills and fatigue.  HENT: Positive for dental problem.       Objective:   Physical Exam  Constitutional: She is oriented to person, place, and time. She appears well-developed and well-nourished. No distress.  HENT:  Head: Normocephalic and atraumatic.  Mouth/Throat: Dental abscesses  present.  There is a pointing abscess lateral to the R second upper molar with pus expressible and tenderness to palpation. There is no submandibular swelling. No facial swelling.   Eyes: Conjunctivae and EOM are normal.  Neck: Neck supple.  Cardiovascular: Normal rate.   Pulmonary/Chest: Effort normal. No respiratory distress.  Musculoskeletal: Normal range of motion.  Lymphadenopathy:       Head (right side): No submandibular adenopathy present.       Head (left side): No submandibular adenopathy present.  Neurological: She is alert and oriented to person, place, and time.  Skin: Skin is warm and dry.  Psychiatric: She has a normal mood and affect. Her behavior is normal.  Nursing note and vitals reviewed.     Assessment & Plan:   I personally performed the services described in this documentation, which was scribed in my presence. The recorded information has been reviewed and is accurate. Dental abscess  Meds ordered this encounter  Medications  . amoxicillin (AMOXIL) 500 MG capsule    Sig: Take 2 capsules (1,000 mg total) by mouth 2 (two) times daily.    Dispense:  40 capsule    Refill:  0   Tylenol or ibuprofen for pain 2 follow-up with dentist in 10 days

## 2014-11-10 ENCOUNTER — Institutional Professional Consult (permissible substitution): Payer: Self-pay | Admitting: Pulmonary Disease

## 2014-12-03 ENCOUNTER — Institutional Professional Consult (permissible substitution): Payer: Self-pay | Admitting: Pulmonary Disease

## 2014-12-09 ENCOUNTER — Institutional Professional Consult (permissible substitution): Payer: Self-pay | Admitting: Pulmonary Disease

## 2015-01-19 ENCOUNTER — Institutional Professional Consult (permissible substitution): Payer: Self-pay | Admitting: Pulmonary Disease

## 2015-01-24 ENCOUNTER — Ambulatory Visit (INDEPENDENT_AMBULATORY_CARE_PROVIDER_SITE_OTHER): Payer: 59 | Admitting: Internal Medicine

## 2015-01-24 VITALS — BP 128/72 | HR 79 | Temp 98.5°F | Resp 16 | Ht 61.0 in | Wt 220.4 lb

## 2015-01-24 DIAGNOSIS — R21 Rash and other nonspecific skin eruption: Secondary | ICD-10-CM | POA: Diagnosis not present

## 2015-01-24 DIAGNOSIS — L739 Follicular disorder, unspecified: Secondary | ICD-10-CM | POA: Diagnosis not present

## 2015-01-24 DIAGNOSIS — L410 Pityriasis lichenoides et varioliformis acuta: Secondary | ICD-10-CM | POA: Diagnosis not present

## 2015-01-24 MED ORDER — DOXYCYCLINE HYCLATE 100 MG PO TABS
100.0000 mg | ORAL_TABLET | Freq: Two times a day (BID) | ORAL | Status: DC
Start: 2015-01-24 — End: 2016-05-22

## 2015-01-24 MED ORDER — CLOBETASOL PROP EMOLLIENT BASE 0.05 % EX CREA: 1.0000 "application " | TOPICAL_CREAM | Freq: Two times a day (BID) | CUTANEOUS | Status: DC

## 2015-01-24 NOTE — Progress Notes (Signed)
Patient ID: Pamela Roberts, female   DOB: 07/11/1960, 54 y.o.   MRN: [id number]   01/24/2015 at 11:41 AM  Pamela ConnorsShirley Roberts / DOB: 06/25/1960 / MRN: [id number]  Problem list reviewed and updated by me where necessary.   SUBJECTIVE  Pamela Roberts is a 54 y.o. well appearing female presenting for the chief complaint of itchy red bumps on legs with hyperpigmented macules after healing..     She  has a past medical history of Hypothyroidism and SOB (shortness of breath).    Medications reviewed and updated by myself where necessary, and exist elsewhere in the encounter.   Pamela Roberts has No Known Allergies. She  reports that she has never smoked. She has never used smokeless tobacco. She reports that she does not drink alcohol or use illicit drugs. She  reports that she currently engages in sexual activity. She reports using the following method of birth control/protection: Surgical. The patient  has past surgical history that includes Ectopic pregnancy surgery; Cesarean section w/btl; and Abdominal hysterectomy.  Her family history includes Asthma in her brother; Diabetes in her brother; Heart disease in her mother; Hypertension in her mother and sister; Lung cancer in her father.  Review of Systems  Constitutional: Negative for fever.  Respiratory: Negative for shortness of breath.   Cardiovascular: Negative for chest pain.  Gastrointestinal: Negative for nausea.  Skin: Negative for rash.  Neurological: Negative for dizziness and headaches.    OBJECTIVE  Her  height is 5\' 1"  (1.549 m) and weight is 220 lb 6.4 oz (99.973 kg). Her oral temperature is 98.5 F (36.9 C). Her blood pressure is 128/72 and her pulse is 79. Her respiration is 16 and oxygen saturation is 98%.  The patient's body mass index is 41.67 kg/(m^2).  Physical Exam  Constitutional: She is oriented to person, place, and time. She appears well-developed and well-nourished.  HENT:  Head: Normocephalic.  Eyes: Conjunctivae are  normal. No scleral icterus.  Neck: Normal range of motion.  Cardiovascular: Normal rate.   Respiratory: Effort normal.  GI: She exhibits no distension.  Musculoskeletal: Normal range of motion.  Neurological: She is alert and oriented to person, place, and time. Coordination normal.  Skin: Skin is warm and dry.  Psychiatric: She has a normal mood and affect.    No results found for this or any previous visit (from the past 24 hour(s)).  ASSESSMENT & PLAN  There are no diagnoses linked to this encounter.

## 2015-01-24 NOTE — Patient Instructions (Signed)
Folliculitis °Folliculitis is redness, soreness, and swelling (inflammation) of the hair follicles. This condition can occur anywhere on the body. People with weakened immune systems, diabetes, or obesity have a greater risk of getting folliculitis. °CAUSES °· Bacterial infection. This is the most common cause. °· Fungal infection. °· Viral infection. °· Contact with certain chemicals, especially oils and tars. °Long-term folliculitis can result from bacteria that live in the nostrils. The bacteria may trigger multiple outbreaks of folliculitis over time. °SYMPTOMS °Folliculitis most commonly occurs on the scalp, thighs, legs, back, buttocks, and areas where hair is shaved frequently. An early sign of folliculitis is a small, white or yellow, pus-filled, itchy lesion (pustule). These lesions appear on a red, inflamed follicle. They are usually less than 0.2 inches (5 mm) wide. When there is an infection of the follicle that goes deeper, it becomes a boil or furuncle. A group of closely packed boils creates a larger lesion (carbuncle). Carbuncles tend to occur in hairy, sweaty areas of the body. °DIAGNOSIS  °Your caregiver can usually tell what is wrong by doing a physical exam. A sample may be taken from one of the lesions and tested in a lab. This can help determine what is causing your folliculitis. °TREATMENT  °Treatment may include: °· Applying warm compresses to the affected areas. °· Taking antibiotic medicines orally or applying them to the skin. °· Draining the lesions if they contain a large amount of pus or fluid. °· Laser hair removal for cases of long-lasting folliculitis. This helps to prevent regrowth of the hair. °HOME CARE INSTRUCTIONS °· Apply warm compresses to the affected areas as directed by your caregiver. °· If antibiotics are prescribed, take them as directed. Finish them even if you start to feel better. °· You may take over-the-counter medicines to relieve itching. °· Do not shave irritated  skin. °· Follow up with your caregiver as directed. °SEEK IMMEDIATE MEDICAL CARE IF:  °· You have increasing redness, swelling, or pain in the affected area. °· You have a fever. °MAKE SURE YOU: °· Understand these instructions. °· Will watch your condition. °· Will get help right away if you are not doing well or get worse. °  °This information is not intended to replace advice given to you by your health care provider. Make sure you discuss any questions you have with your health care provider. °  °Document Released: 05/28/2001 Document Revised: 04/09/2014 Document Reviewed: 06/19/2011 °Elsevier Interactive Patient Education ©2016 [employer] ° °

## 2015-01-25 ENCOUNTER — Telehealth: Payer: Self-pay

## 2015-01-25 NOTE — Telephone Encounter (Signed)
She was treated for folliculitis, her patner does not need to be treated. Please advise.

## 2015-01-25 NOTE — Telephone Encounter (Addendum)
Pt states she wanted to get a double dose of the medication for her partner since he need to take it also, didn't want to give his name but stated if we doubled up on hers, he can get some also Please call pt at [phone]     [facility] [address]

## 2015-01-25 NOTE — Telephone Encounter (Signed)
Please advise pt-she thinks she is contagious and that her partner needs this script. He has a similar rash. CB # [phone]

## 2015-01-26 NOTE — Telephone Encounter (Signed)
Pt advised.

## 2015-01-26 NOTE — Telephone Encounter (Signed)
Generally, folliculitis is not considered contagious. If her partner has a similar rash, he needs evaluation as well.

## 2015-05-20 ENCOUNTER — Institutional Professional Consult (permissible substitution): Payer: Self-pay | Admitting: Pulmonary Disease

## 2015-06-14 ENCOUNTER — Institutional Professional Consult (permissible substitution): Payer: Self-pay | Admitting: Pulmonary Disease

## 2015-12-23 ENCOUNTER — Encounter (HOSPITAL_COMMUNITY): Payer: Self-pay | Admitting: Emergency Medicine

## 2015-12-23 ENCOUNTER — Ambulatory Visit: Payer: 59

## 2015-12-23 ENCOUNTER — Ambulatory Visit (HOSPITAL_COMMUNITY)
Admission: EM | Admit: 2015-12-23 | Discharge: 2015-12-23 | Disposition: A | Discharge status: Home / Self Care | Payer: Self-pay

## 2015-12-23 DIAGNOSIS — R0989 Other specified symptoms and signs involving the circulatory and respiratory systems: Secondary | ICD-10-CM

## 2015-12-23 NOTE — ED Triage Notes (Signed)
[facility] with a complaint of feeling like there is a piece of plastic in her throat. The patient stated that 4 days ago a piece of plastic on the lid of her salad broke off and she believed that she may have swallowed it.

## 2015-12-23 NOTE — Discharge Instructions (Signed)
You will need to see ENT for follow up and possible evaluation for foreign body.

## 2015-12-23 NOTE — ED Provider Notes (Signed)
CSN: [id number]     Arrival date & time 12/23/15  1024 History   First MD Initiated Contact with Patient 12/23/15 1121     Chief Complaint  Patient presents with  . Swallowed Foreign Body   (Consider location/radiation/quality/duration/timing/severity/associated sxs/prior Treatment) HPI This is a 55 year old female who states that she was eating a salad a couple of days ago and she may have swallowed a piece of the plastic saran wrap around a salad and thinks it is lodged in her throat at this time. She states that she has difficulty swallowing. As she feels something moving in her throat. She has tried multiple foods to see if she can get it to move but has been unsuccessful. Patient states that she knows what a scratched the esophagus feels like an this feels like a foreign body. Past Medical History:  Diagnosis Date  . Hypothyroidism   . SOB (shortness of breath)    Past Surgical History:  Procedure Laterality Date  . ABDOMINAL HYSTERECTOMY    . CESAREAN SECTION W/BTL    . ECTOPIC PREGNANCY SURGERY     Family History  Problem Relation Age of Onset  . Asthma Brother   . Heart disease Mother   . Hypertension Mother   . Lung cancer Father     was a smoker  . Diabetes Brother   . Hypertension Sister    Social History  Substance Use Topics  . Smoking status: Never Smoker  . Smokeless tobacco: Never Used  . Alcohol use No   OB History    No data available     Review of Systems  Denies: HEADACHE, NAUSEA, ABDOMINAL PAIN, CHEST PAIN, CONGESTION, DYSURIA, SHORTNESS OF BREATH  Allergies  Review of patient's allergies indicates no known allergies.  Home Medications   Prior to Admission medications   Medication Sig Start Date End Date Taking? Authorizing Provider  levothyroxine (SYNTHROID, LEVOTHROID) 100 MCG tablet Take 100 mcg by mouth daily.     Yes Historical Provider, MD  Clobetasol Prop Emollient Base 0.05 % emollient cream Apply 1 application topically 2 (two) times  daily. 01/24/15   Jonita Albee, MD  doxycycline (VIBRA-TABS) 100 MG tablet Take 1 tablet (100 mg total) by mouth 2 (two) times daily. 01/24/15   Jonita Albee, MD  ipratropium (ATROVENT) 0.06 % nasal spray Place 2 sprays into both nostrils 4 (four) times daily. For nasal congestion Patient not taking: Reported on 03/02/2014 07/29/13   Ria Clock, PA  omeprazole (PRILOSEC) 40 MG capsule Take 1 capsule (40 mg total) by mouth 2 (two) times daily. Patient not taking: Reported on 03/02/2014 05/27/13   Rodolph Bong, MD   Meds Ordered and Administered this Visit  Medications - No data to display  BP 124/79 (BP Location: Left Arm)   Pulse 75   Temp 97.9 F (36.6 C) (Oral)   Resp 15   SpO2 100%  No data found.   Physical Exam NURSES NOTES AND VITAL SIGNS REVIEWED. CONSTITUTIONAL: Well developed, well nourished, no acute distress HEENT: normocephalic, atraumatic, posterior pharynx looks absolutely normal. EYES: Conjunctiva normal NECK:normal ROM, supple, no adenopathy PULMONARY:No respiratory distress, normal effort ABDOMINAL: Soft, ND, NT BS+, No CVAT MUSCULOSKELETAL: Normal ROM of all extremities,  SKIN: warm and dry without rash PSYCHIATRIC: Mood and affect, behavior are normal  Urgent Care Course   Clinical Course    Procedures (including critical care time)  Labs Review Labs Reviewed - No data to display  Imaging Review No  results found.   Visual Acuity Review  Right Eye Distance:   Left Eye Distance:   Bilateral Distance:    Right Eye Near:   Left Eye Near:    Bilateral Near:       Patient is given information about ENT that is on call for the hospital today. She is advised to call and set up an appointment. I have discussed with patient that I think this is most likely a scratched or abrasions to esophagus. She states that she would prefer to have someone look and make absolutely certain  MDM   1. Foreign body sensation in throat     Patient is  reassured that there are no issues that require transfer to higher level of care at this time or additional tests. Patient is advised to continue home symptomatic treatment. Patient is advised that if there are new or worsening symptoms to attend the emergency department, contact primary care provider, or return to UC. Instructions of care provided discharged home in stable condition.    THIS NOTE WAS GENERATED USING A VOICE RECOGNITION SOFTWARE PROGRAM. ALL REASONABLE EFFORTS  WERE MADE TO PROOFREAD THIS DOCUMENT FOR ACCURACY.  I have verbally reviewed the discharge instructions with the patient. A printed AVS was given to the patient.  All questions were answered prior to discharge.      Tharon AquasFrank C Patrick, PA 12/23/15 1940

## 2016-05-22 ENCOUNTER — Ambulatory Visit (INDEPENDENT_AMBULATORY_CARE_PROVIDER_SITE_OTHER): Payer: Self-pay | Admitting: Physician Assistant

## 2016-05-22 ENCOUNTER — Ambulatory Visit (INDEPENDENT_AMBULATORY_CARE_PROVIDER_SITE_OTHER): Payer: Self-pay

## 2016-05-22 VITALS — BP 118/74 | HR 90 | Temp 101.0°F | Resp 16 | Ht 61.0 in | Wt 266.0 lb

## 2016-05-22 DIAGNOSIS — R059 Cough, unspecified: Secondary | ICD-10-CM

## 2016-05-22 DIAGNOSIS — R05 Cough: Secondary | ICD-10-CM

## 2016-05-22 DIAGNOSIS — Z2089 Contact with and (suspected) exposure to other communicable diseases: Secondary | ICD-10-CM

## 2016-05-22 DIAGNOSIS — J029 Acute pharyngitis, unspecified: Secondary | ICD-10-CM

## 2016-05-22 DIAGNOSIS — J101 Influenza due to other identified influenza virus with other respiratory manifestations: Secondary | ICD-10-CM

## 2016-05-22 DIAGNOSIS — Z20828 Contact with and (suspected) exposure to other viral communicable diseases: Secondary | ICD-10-CM

## 2016-05-22 DIAGNOSIS — R52 Pain, unspecified: Secondary | ICD-10-CM

## 2016-05-22 LAB — POCT INFLUENZA A/B
INFLUENZA B, POC: NEGATIVE
Influenza A, POC: POSITIVE — AB

## 2016-05-22 LAB — POCT RAPID STREP A (OFFICE): RAPID STREP A SCREEN: NEGATIVE

## 2016-05-22 MED ORDER — OSELTAMIVIR PHOSPHATE 75 MG PO CAPS: 75.0000 mg | ORAL_CAPSULE | Freq: Two times a day (BID) | ORAL | 0 refills | Status: DC

## 2016-05-22 MED ORDER — BENZONATATE 100 MG PO CAPS: 100.0000 mg | ORAL_CAPSULE | Freq: Three times a day (TID) | ORAL | 0 refills | Status: DC | PRN

## 2016-05-22 MED ORDER — HYDROCOD POLST-CPM POLST ER 10-8 MG/5ML PO SUER: 5.0000 mL | Freq: Two times a day (BID) | ORAL | 0 refills | Status: DC | PRN

## 2016-05-22 MED ORDER — AZITHROMYCIN 250 MG PO TABS: ORAL_TABLET | ORAL | 0 refills | Status: DC

## 2016-05-22 NOTE — Patient Instructions (Addendum)
I am going to treat you for both the flu and pneumonia. Please take azithromycin and tamiflu as prescribed. You are contagious until you are fever free for 24 hours without using tylenol or ibuprofen. It is really important that you stay out of work until you are no longer contagious. I would also recommend staying away from relatives of young age during this time and continue washing your hands and wearing your mask. If any of your symptoms start to worsen, seek care immediately. Thank you for letting me participate in your health and well being.   Influenza, Adult Influenza ("the flu") is an infection in the lungs, nose, and throat (respiratory tract). It is caused by a virus. The flu causes many common cold symptoms, as well as a high fever and body aches. It can make you feel very sick. The flu spreads easily from person to person (is contagious). Getting a flu shot (influenza vaccination) every year is the best way to prevent the flu. Follow these instructions at home:  Take over-the-counter and prescription medicines only as told by your doctor.  Use a cool mist humidifier to add moisture (humidity) to the air in your home. This can make it easier to breathe.  Rest as needed.  Drink enough fluid to keep your pee (urine) clear or pale yellow.  Cover your mouth and nose when you cough or sneeze.  Wash your hands with soap and water often, especially after you cough or sneeze. If you cannot use soap and water, use hand sanitizer.  Stay home from work or school as told by your doctor. Unless you are visiting your doctor, try to avoid leaving home until your fever has been gone for 24 hours without the use of medicine.  Keep all follow-up visits as told by your doctor. This is important. How is this prevented?  Getting a yearly (annual) flu shot is the best way to avoid getting the flu. You may get the flu shot in late summer, fall, or winter. Ask your doctor when you should get your flu  shot.  Wash your hands often or use hand sanitizer often.  Avoid contact with people who are sick during cold and flu season.  Eat healthy foods.  Drink plenty of fluids.  Get enough sleep.  Exercise regularly. Contact a doctor if:  You get new symptoms.  You have:  Chest pain.  Watery poop (diarrhea).  A fever.  Your cough gets worse.  You start to have more mucus.  You feel sick to your stomach (nauseous).  You throw up (vomit). Get help right away if:  You start to be short of breath or have trouble breathing.  Your skin or nails turn a bluish color.  You have very bad pain or stiffness in your neck.  You get a sudden headache.  You get sudden pain in your face or ear.  You cannot stop throwing up. This information is not intended to replace advice given to you by your health care provider. Make sure you discuss any questions you have with your health care provider. Document Released: 12/27/2007 Document Revised: 08/25/2015 Document Reviewed: 01/11/2015 Elsevier Interactive Patient Education  2017 Elsevier Inc.    Community-Acquired Pneumonia, Adult Introduction Pneumonia is an infection of the lungs. One type of pneumonia can happen while a person is in a hospital. A different type can happen when a person is not in a hospital (community-acquired pneumonia). It is easy for this kind to spread from person to  person. It can spread to you if you breathe near an infected person who coughs or sneezes. Some symptoms include:  A dry cough.  A wet (productive) cough.  Fever.  Sweating.  Chest pain. Follow these instructions at home:  Take over-the-counter and prescription medicines only as told by your doctor.  Only take cough medicine if you are losing sleep.  If you were prescribed an antibiotic medicine, take it as told by your doctor. Do not stop taking the antibiotic even if you start to feel better.  Sleep with your head and neck raised  (elevated). You can do this by putting a few pillows under your head, or you can sleep in a recliner.  Do not use tobacco products. These include cigarettes, chewing tobacco, and e-cigarettes. If you need help quitting, ask your doctor.  Drink enough water to keep your pee (urine) clear or pale yellow. A shot (vaccine) can help prevent pneumonia. Shots are often suggested for:  People older than 56 years of age.  People older than 56 years of age:  Who are having cancer treatment.  Who have long-term (chronic) lung disease.  Who have problems with their body's defense system (immune system). You may also prevent pneumonia if you take these actions:  Get the flu (influenza) shot every year.  Go to the dentist as often as told.  Wash your hands often. If soap and water are not available, use hand sanitizer. Contact a doctor if:  You have a fever.  You lose sleep because your cough medicine does not help. Get help right away if:  You are short of breath and it gets worse.  You have more chest pain.  Your sickness gets worse. This is very serious if:  You are an older adult.  Your body's defense system is weak.  You cough up blood. This information is not intended to replace advice given to you by your health care provider. Make sure you discuss any questions you have with your health care provider. Document Released: 09/05/2007 Document Revised: 08/25/2015 Document Reviewed: 07/14/2014  2017 Elsevier    IF you received an x-ray today, you will receive an invoice from [facility]. Please contact [facility] at [phone] with questions or concerns regarding your invoice.   IF you received labwork today, you will receive an invoice from [facility]. Please contact [facility] at [phone] with questions or concerns regarding your invoice.   Our billing staff will not be able to assist you with questions regarding bills from these companies.  You will  be contacted with the lab results as soon as they are available. The fastest way to get your results is to activate your My Chart account. Instructions are located on the last page of this paperwork. If you have not heard from us regarding the results in 2 weeks, please contact this office.

## 2016-05-22 NOTE — Progress Notes (Signed)
MRN: [id number] DOB: 1960/04/21  Subjective:   Pamela Roberts is a 56 y.o. female presenting for chief complaint of Cough; Headache; Fatigue; Wheezing; Back Pain; and Chills .  Reports 3 day history of rhinorrhea, sore throat, productive cough (no hemoptysis), wheezing and myalgia, fever (101) and night sweats. Has tried tylenol with mild relief. Denies  sinus congestion, ear pain, shortness of breath, chest tightness and chest pain, nausea, vomiting, abdominal pain and diarrhea. Has had sick contact with grandson who has pneumonia. No history of seasonal allergies or history of asthma. Patient has not had flu shot this season. Denies smoking and alcohol use.  Last took tylenol one hour before arrival.   Pamela Roberts has a current medication list which includes the following prescription(s): clobetasol prop emollient base, ipratropium, omeprazole, and levothyroxine. Also is allergic to other.  Pamela Roberts  has a past medical history of Hypothyroidism and SOB (shortness of breath). Also  has a past surgical history that includes Ectopic pregnancy surgery; Cesarean section w/btl; and Abdominal hysterectomy.   Objective:   Vitals: BP 118/74 (BP Location: Right Arm, Patient Position: Sitting, Cuff Size: Large)   Pulse 90   Temp (!) 101 F (38.3 C) (Oral)   Resp 16   Ht 5\' 1"  (1.549 m)   Wt 266 lb (120.7 kg)   SpO2 95%   BMI 50.26 kg/m   Physical Exam  Constitutional: She is oriented to person, place, and time. She appears well-developed and well-nourished. She appears distressed (appears tired sitting on exam table).  HENT:  Head: Normocephalic and atraumatic.  Right Ear: Tympanic membrane, external ear and ear canal normal.  Left Ear: Tympanic membrane, external ear and ear canal normal.  Nose: Mucosal edema and rhinorrhea present. Right sinus exhibits no maxillary sinus tenderness and no frontal sinus tenderness. Left sinus exhibits no maxillary sinus tenderness and no frontal sinus  tenderness.  Mouth/Throat: Uvula is midline and mucous membranes are normal. Posterior oropharyngeal erythema present. Tonsils are 2+ on the right. Tonsils are 2+ on the left. Tonsillar exudate.  Eyes: Conjunctivae are normal.  Neck: Normal range of motion.  Cardiovascular: Normal rate, regular rhythm and normal heart sounds.   Pulmonary/Chest: Effort normal.  Difficult to auscultate as patient kept coughing throughout lung exam   Lymphadenopathy:       Head (right side): No submental, no submandibular, no tonsillar, no preauricular, no posterior auricular and no occipital adenopathy present.       Head (left side): No submental, no submandibular, no tonsillar, no preauricular, no posterior auricular and no occipital adenopathy present.    She has no cervical adenopathy.       Right: No supraclavicular adenopathy present.       Left: No supraclavicular adenopathy present.  Neurological: She is alert and oriented to person, place, and time.  Skin: Skin is warm and dry.  Psychiatric: She has a normal mood and affect.  Vitals reviewed.   Results for orders placed or performed in visit on 05/22/16 (from the past 24 hour(s))  POCT Influenza A/B     Status: Abnormal   Collection Time: 05/22/16  9:42 AM  Result Value Ref Range   Influenza A, POC Positive (A) Negative   Influenza B, POC Negative Negative  POCT rapid strep A     Status: None   Collection Time: 05/22/16  9:42 AM  Result Value Ref Range   Rapid Strep A Screen Negative Negative   Dg Chest 2 View  Result Date: 05/22/2016 CLINICAL  DATA:  Recently exposed to family member with pneumonia. The patient reports cough and fever for 3 days. EXAM: CHEST  2 VIEW COMPARISON:  None in PACs FINDINGS: The lungs are well-expanded. There is no focal infiltrate. There is no pleural effusion. Minimal linear density is noted at both lung bases that likely reflect scarring or subsegmental atelectasis. The heart and pulmonary vascularity are normal.  The mediastinum is normal in width. There is multilevel degenerative disc disease of the thoracic spine with endplate osteophytes. IMPRESSION: No acute pneumonia. Minimal subsegmental atelectasis or scarring at the lung bases. Electronically Signed   By: David  SwazilandJordan M.D.   On: 05/22/2016 09:35    Assessment and Plan :  1. Sore throat - POCT rapid strep A  2. Body aches - POCT Influenza A/B  3. Cough - DG Chest 2 View; Future - benzonatate (TESSALON) 100 MG capsule; Take 1-2 capsules (100-200 mg total) by mouth 3 (three) times daily as needed for cough.  Dispense: 40 capsule; Refill: 0 - chlorpheniramine-HYDROcodone (TUSSIONEX PENNKINETIC ER) 10-8 MG/5ML SUER; Take 5 mLs by mouth every 12 (twelve) hours as needed for cough.  Dispense: 100 mL; Refill: 0  4. Influenza A - Pt given educational material on influenza. Informed to continue wearing mask and washing hands.  - oseltamivir (TAMIFLU) 75 MG capsule; Take 1 capsule (75 mg total) by mouth 2 (two) times daily.  Dispense: 10 capsule; Refill: 0  5. Exposure to pneumonia -Due to vitals, physical exam findings, and recent exposure to grandson who has pneumonia, will treat empirically for pneumonia.   - azithromycin (ZITHROMAX) 250 MG tablet; Take 2 tabs PO x 1 dose, then 1 tab PO QD x 4 days  Dispense: 6 tablet; Refill: 0  -Return to clinic if symptoms worsen, do not improve in 7-10 days, or as needed  Benjiman CoreBrittany Wiseman, PA-C  [facility] 05/22/2016 9:47 AM

## 2016-11-04 ENCOUNTER — Encounter (HOSPITAL_COMMUNITY): Payer: Self-pay | Admitting: *Deleted

## 2016-11-04 ENCOUNTER — Emergency Department (HOSPITAL_COMMUNITY)
Admission: EM | Admit: 2016-11-04 | Discharge: 2016-11-04 | Disposition: A | Discharge status: Home / Self Care | Payer: Self-pay | Attending: Emergency Medicine | Admitting: Emergency Medicine

## 2016-11-04 DIAGNOSIS — E039 Hypothyroidism, unspecified: Secondary | ICD-10-CM | POA: Insufficient documentation

## 2016-11-04 DIAGNOSIS — N390 Urinary tract infection, site not specified: Secondary | ICD-10-CM | POA: Insufficient documentation

## 2016-11-04 DIAGNOSIS — Z79899 Other long term (current) drug therapy: Secondary | ICD-10-CM | POA: Insufficient documentation

## 2016-11-04 LAB — URINALYSIS, ROUTINE W REFLEX MICROSCOPIC
Glucose, UA: NEGATIVE mg/dL
Ketones, ur: NEGATIVE mg/dL
NITRITE: POSITIVE — AB
PH: 5.5 (ref 5.0–8.0)
Protein, ur: >300 mg/dL — AB

## 2016-11-04 LAB — BASIC METABOLIC PANEL
Anion gap: 10 (ref 5–15)
BUN: 9 mg/dL (ref 6–20)
CHLORIDE: 106 mmol/L (ref 101–111)
CO2: 25 mmol/L (ref 22–32)
CREATININE: 0.84 mg/dL (ref 0.44–1.00)
Calcium: 8.6 mg/dL — ABNORMAL LOW (ref 8.9–10.3)
GFR calc Af Amer: >60 mL/min (ref >60)
GFR calc non Af Amer: >60 mL/min (ref >60)
Glucose, Bld: 121 mg/dL — ABNORMAL HIGH (ref 65–99)
Potassium: 4 mmol/L (ref 3.5–5.1)
SODIUM: 141 mmol/L (ref 135–145)

## 2016-11-04 LAB — CBC WITH DIFFERENTIAL/PLATELET
Basophils Absolute: 0.1 10*3/uL (ref 0.0–0.1)
Basophils Relative: 1 %
EOS ABS: 0.1 10*3/uL (ref 0.0–0.7)
EOS PCT: 1 %
HCT: 39.8 % (ref 36.0–46.0)
Hemoglobin: 13 g/dL (ref 12.0–15.0)
LYMPHS ABS: 2 10*3/uL (ref 0.7–4.0)
Lymphocytes Relative: 18 %
MCH: 28.1 pg (ref 26.0–34.0)
MCHC: 32.7 g/dL (ref 30.0–36.0)
MCV: 86 fL (ref 78.0–100.0)
Monocytes Absolute: 0.7 10*3/uL (ref 0.1–1.0)
Monocytes Relative: 7 %
Neutro Abs: 8.2 10*3/uL — ABNORMAL HIGH (ref 1.7–7.7)
Neutrophils Relative %: 73 %
PLATELETS: 221 10*3/uL (ref 150–400)
RBC: 4.63 MIL/uL (ref 3.87–5.11)
RDW: 14.4 % (ref 11.5–15.5)
WBC: 11.1 10*3/uL — AB (ref 4.0–10.5)

## 2016-11-04 LAB — URINALYSIS, MICROSCOPIC (REFLEX)

## 2016-11-04 MED ORDER — CEPHALEXIN 500 MG PO CAPS: 500.0000 mg | ORAL_CAPSULE | Freq: Three times a day (TID) | ORAL | 0 refills | Status: DC

## 2016-11-04 MED ORDER — DEXTROSE 5 % IV SOLN
1.0000 g | Freq: Once | INTRAVENOUS | Status: AC
  Administered 2016-11-04: 1 g via INTRAVENOUS
  Filled 2016-11-04: qty 10

## 2016-11-04 MED ORDER — SODIUM CHLORIDE 0.9 % IV BOLUS (SEPSIS)
1000.0000 mL | Freq: Once | INTRAVENOUS | Status: AC
  Administered 2016-11-04: 1000 mL via INTRAVENOUS

## 2016-11-04 NOTE — ED Notes (Signed)
Pt understood dc material. NAD noted. Scripts given at dc 

## 2016-11-04 NOTE — ED Provider Notes (Signed)
[facility]-EMERGENCY DEPT Provider Note   CSN: [id number] Arrival date & time: 11/04/16  1603     History   Chief Complaint Chief Complaint  Patient presents with  . Urinary Frequency    HPI Pamela Roberts is a 56 y.o. female.  The history is provided by the patient. No language interpreter was used.  Urinary Frequency     Pamela Roberts is a 56 y.o. female who presents to the Emergency Department complaining of urinary frequency.  About 1:00 today she developed urinary frequency having to urinate every several minutes. She reports blood mixed in her urine. She denies any abdominal pain, fever, nausea, vomiting, back pain, vaginal discharge. She does endorse feeling tired today but she works nights and it is typical for her to be tired during the day. No prior similar symptoms.  Past Medical History:  Diagnosis Date  . Hypothyroidism   . SOB (shortness of breath)     Patient Active Problem List   Diagnosis Date Noted  . Dyspnea 08/02/2011    Past Surgical History:  Procedure Laterality Date  . ABDOMINAL HYSTERECTOMY    . CESAREAN SECTION W/BTL    . ECTOPIC PREGNANCY SURGERY      OB History    No data available       Home Medications    Prior to Admission medications   Medication Sig Start Date End Date Taking? Authorizing Provider  azithromycin (ZITHROMAX) 250 MG tablet Take 2 tabs PO x 1 dose, then 1 tab PO QD x 4 days 05/22/16   Benjiman CoreWiseman, Brittany D, PA-C  benzonatate (TESSALON) 100 MG capsule Take 1-2 capsules (100-200 mg total) by mouth 3 (three) times daily as needed for cough. 05/22/16   Benjiman CoreWiseman, Brittany D, PA-C  cephALEXin (KEFLEX) 500 MG capsule Take 1 capsule (500 mg total) by mouth 3 (three) times daily. 11/04/16   Tilden Fossaees, Cythia Bachtel, MD  chlorpheniramine-HYDROcodone [facility](TUSSIONEX PENNKINETIC ER) 10-8 MG/5ML SUER Take 5 mLs by mouth every 12 (twelve) hours as needed for cough. 05/22/16   Benjiman CoreWiseman, Brittany D, PA-C  Clobetasol Prop Emollient Base 0.05 % emollient cream  Apply 1 application topically 2 (two) times daily. 01/24/15   Jonita AlbeeGuest, Chris W, MD  ipratropium (ATROVENT) 0.06 % nasal spray Place 2 sprays into both nostrils 4 (four) times daily. For nasal congestion 07/29/13   Presson, Jess BartersJennifer Lee H, PA  levothyroxine (SYNTHROID, LEVOTHROID) 112 MCG tablet Take by mouth.    [provider]  omeprazole (PRILOSEC) 40 MG capsule Take 1 capsule (40 mg total) by mouth 2 (two) times daily. 05/27/13   Rodolph Bongorey, Evan S, MD  oseltamivir (TAMIFLU) 75 MG capsule Take 1 capsule (75 mg total) by mouth 2 (two) times daily. 05/22/16   Magdalene RiverWiseman, Brittany D, PA-C    Family History Family History  Problem Relation Age of Onset  . Asthma Brother   . Heart disease Mother   . Hypertension Mother   . Lung cancer Father        was a smoker  . Hypertension Sister   . Diabetes Brother     Social History Social History  Substance Use Topics  . Smoking status: Never Smoker  . Smokeless tobacco: Never Used  . Alcohol use No     Allergies   Other   Review of Systems Review of Systems  Genitourinary: Positive for frequency.  All other systems reviewed and are negative.    Physical Exam Updated Vital Signs BP (!) 107/51   Pulse 81   Temp 100  F (37.8 C) (Oral)   Resp 18   SpO2 96%   Physical Exam  Constitutional: She is oriented to person, place, and time. She appears well-developed and well-nourished.  HENT:  Head: Normocephalic and atraumatic.  Cardiovascular: Normal rate and regular rhythm.   No murmur heard. Pulmonary/Chest: Effort normal and breath sounds normal. No respiratory distress.  Abdominal: Soft. There is no rebound and no guarding.  Mild suprapubic tenderness  Musculoskeletal: She exhibits no edema or tenderness.  Neurological: She is alert and oriented to person, place, and time.  Skin: Skin is warm and dry.  Psychiatric: She has a normal mood and affect. Her behavior is normal.  Nursing note and vitals reviewed.    ED  Treatments / Results  Labs (all labs ordered are listed, but only abnormal results are displayed) Labs Reviewed  URINALYSIS, ROUTINE W REFLEX MICROSCOPIC - Abnormal; Notable for the following:       Result Value   Color, Urine BROWN (*)    APPearance TURBID (*)    Specific Gravity, Urine >1.030 (*)    Hgb urine dipstick LARGE (*)    Bilirubin Urine MODERATE (*)    Protein, ur >300 (*)    Nitrite POSITIVE (*)    Leukocytes, UA MODERATE (*)    All other components within normal limits  URINALYSIS, MICROSCOPIC (REFLEX) - Abnormal; Notable for the following:    Bacteria, UA MANY (*)    Squamous Epithelial / LPF TOO NUMEROUS TO COUNT (*)    All other components within normal limits  BASIC METABOLIC PANEL - Abnormal; Notable for the following:    Glucose, Bld 121 (*)    Calcium 8.6 (*)    All other components within normal limits  CBC WITH DIFFERENTIAL/PLATELET - Abnormal; Notable for the following:    WBC 11.1 (*)    Neutro Abs 8.2 (*)    All other components within normal limits  URINE CULTURE  RPR  HIV ANTIBODY (ROUTINE TESTING)  GC/CHLAMYDIA PROBE AMP ([place]) NOT [facility]    EKG  EKG Interpretation None       Radiology No results found.  Procedures Procedures (including critical care time)  Medications Ordered in ED Medications  sodium chloride 0.9 % bolus 1,000 mL (0 mLs Intravenous Stopped 11/04/16 1926)  cefTRIAXone (ROCEPHIN) 1 g in dextrose 5 % 50 mL IVPB (0 g Intravenous Stopped 11/04/16 1831)     Initial Impression / Assessment and Plan / ED Course  I have reviewed the triage vital signs and the nursing notes.  Pertinent labs & imaging results that were available during my care of the patient were reviewed by me and considered in my medical decision making (see chart for details).     Patient here for evaluation of urinary frequency and hematuria. She is nontoxic appearing on examination with temperature 100 and the department. Concern for UTI based  on symptoms and presentation. Presentation is not consistent with kidneystone, rhabdomyolysis.  D/w patient on home care for UTI with oral fluid hydration, antibiotics. Discuss close outpatient follow-up and return precautions.  Final Clinical Impressions(s) / ED Diagnoses   Final diagnoses:  Acute UTI    New Prescriptions Discharge Medication List as of 11/04/2016  6:48 PM    START taking these medications   Details  cephALEXin (KEFLEX) 500 MG capsule Take 1 capsule (500 mg total) by mouth 3 (three) times daily., Starting Sun 11/04/2016, Print         Tilden Fossaees, Aravind Chrismer, MD 11/04/16 [phone]

## 2016-11-04 NOTE — ED Triage Notes (Signed)
To ED for eval of painful urination and spots of blood on toilet paper after urination and blood in toilet. No pain. No nausea. No vomiting.

## 2016-11-05 LAB — HIV ANTIBODY (ROUTINE TESTING W REFLEX): HIV Screen 4th Generation wRfx: NONREACTIVE

## 2016-11-05 LAB — GC/CHLAMYDIA PROBE AMP (~~LOC~~) NOT AT ARMC
Chlamydia: NEGATIVE
Neisseria Gonorrhea: NEGATIVE

## 2016-11-05 LAB — RPR: RPR Ser Ql: NONREACTIVE

## 2016-11-07 LAB — URINE CULTURE: Culture: >=100000 — AB

## 2016-11-08 ENCOUNTER — Telehealth: Payer: Self-pay | Admitting: *Deleted

## 2016-11-08 NOTE — Telephone Encounter (Signed)
Post ED Visit - Positive Culture Follow-up  Culture report reviewed by antimicrobial stewardship pharmacist:  []  Enzo BiNathan Batchelder, Pharm.D. []  Celedonio MiyamotoJeremy Frens, Pharm.D., BCPS AQ-ID []  Garvin FilaMike Maccia, Pharm.D., BCPS []  Georgina PillionElizabeth Martin, Pharm.D., BCPS []  [place]h Pham, [place]arm.D., BCPS, AAHIVP []  Estella HuskMichelle Turner, Pharm.D., BCPS, AAHIVP []  Lysle Pearlachel Rumbarger, PharmD, BCPS []  Casilda Carlsaylor Stone, PharmD, BCPS []  Pollyann SamplesAndy Johnston, PharmD, BCPS Donnella Biyler Colvard, PharmD  Positive urine culture Treated with Cephalexin, organism sensitive to the same and no further patient follow-up is required at this time.  Virl AxeRobertson, Cregg Jutte [facility] 11/08/2016, 11:09 AM

## 2016-11-08 NOTE — ED Notes (Signed)
Pt. Called for results of her STD testing Results reviewed with the pt. And pt. Verbalized understanding.

## 2017-06-24 ENCOUNTER — Ambulatory Visit (INDEPENDENT_AMBULATORY_CARE_PROVIDER_SITE_OTHER): Payer: Self-pay | Admitting: Family Medicine

## 2017-06-24 ENCOUNTER — Encounter: Payer: Self-pay | Admitting: Family Medicine

## 2017-06-24 ENCOUNTER — Other Ambulatory Visit: Payer: Self-pay

## 2017-06-24 VITALS — BP 110/80 | HR 65 | Temp 98.6°F | Resp 18 | Ht 61.0 in | Wt 230.2 lb

## 2017-06-24 DIAGNOSIS — N898 Other specified noninflammatory disorders of vagina: Secondary | ICD-10-CM

## 2017-06-24 DIAGNOSIS — R399 Unspecified symptoms and signs involving the genitourinary system: Secondary | ICD-10-CM

## 2017-06-24 DIAGNOSIS — N3000 Acute cystitis without hematuria: Secondary | ICD-10-CM

## 2017-06-24 DIAGNOSIS — F4321 Adjustment disorder with depressed mood: Secondary | ICD-10-CM

## 2017-06-24 LAB — POCT URINALYSIS DIP (MANUAL ENTRY)
BILIRUBIN UA: NEGATIVE mg/dL
Bilirubin, UA: NEGATIVE
Glucose, UA: NEGATIVE mg/dL
Nitrite, UA: POSITIVE — AB
PH UA: 5 (ref 5.0–8.0)
Protein Ur, POC: NEGATIVE mg/dL
RBC UA: NEGATIVE
Urobilinogen, UA: 0.2 E.U./dL

## 2017-06-24 LAB — POCT WET + KOH PREP
Trich by wet prep: ABSENT
YEAST BY WET PREP: ABSENT
Yeast by KOH: ABSENT

## 2017-06-24 LAB — POC MICROSCOPIC URINALYSIS (UMFC): MUCUS RE: ABSENT

## 2017-06-24 MED ORDER — CEPHALEXIN 500 MG PO CAPS: 500.0000 mg | ORAL_CAPSULE | Freq: Three times a day (TID) | ORAL | 0 refills | Status: DC

## 2017-06-24 NOTE — Patient Instructions (Addendum)
If you continue to have the vaginal discharge with odor, you may call for a course of metrogel to treat BV. If you develop an itching and cottage cheese like discharge after the antibiotic, ok to call for a yeast pill.  We recommend that you schedule a mammogram for breast cancer screening. Typically, you do not need a referral to do this. Please contact a local imaging center to schedule your mammogram.  [facility] - [phone]  *ask for the Radiology Department [facility]) - [phone] or [phone]  [facility] - [phone] [facility] - [phone] [facility] - [phone]  *ask for the Radiology Department [facility] - [phone]  *ask for the Radiology Department [facility] - [phone]  *ask for the Mammography Department [facility] - [phone]    IF you received an x-ray today, you will receive an invoice from [facility]. Please contact [facility] at [phone] with questions or concerns regarding your invoice.   IF you received labwork today, you will receive an invoice from [facility]. Please contact [facility] at [phone] with questions or concerns regarding your invoice.   Our billing staff will not be able to assist you with questions regarding bills from these companies.  You will be contacted with the lab results as soon as they are available. The fastest way to get your results is to activate your My Chart account. Instructions are located on the last page of this paperwork. If you have not heard from [place] regarding the results in 2 weeks, please contact this office.     Urinary Tract Infection, Adult A urinary tract infection (UTI) is an infection of any part of the urinary tract, which includes the kidneys, ureters, bladder, and urethra. These organs make, store, and get rid of urine in the body. UTI can be a bladder  infection (cystitis) or kidney infection (pyelonephritis). What are the causes? This infection may be caused by fungi, viruses, or bacteria. Bacteria are the most common cause of UTIs. This condition can also be caused by repeated incomplete emptying of the bladder during urination. What increases the risk? This condition is more likely to develop if:  You ignore your need to urinate or hold urine for long periods of time.  You do not empty your bladder completely during urination.  You wipe back to front after urinating or having a bowel movement, if you are female.  You are uncircumcised, if you are female.  You are constipated.  You have a urinary catheter that stays in place (indwelling).  You have a weak defense (immune) system.  You have a medical condition that affects your bowels, kidneys, or bladder.  You have diabetes.  You take antibiotic medicines frequently or for long periods of time, and the antibiotics no longer work well against certain types of infections (antibiotic resistance).  You take medicines that irritate your urinary tract.  You are exposed to chemicals that irritate your urinary tract.  You are female.  What are the signs or symptoms? Symptoms of this condition include:  Fever.  Frequent urination or passing small amounts of urine frequently.  Needing to urinate urgently.  Pain or burning with urination.  Urine that smells bad or unusual.  Cloudy urine.  Pain in the lower abdomen or back.  Trouble urinating.  Blood in the urine.  Vomiting or being less hungry than normal.  Diarrhea or abdominal pain.  Vaginal discharge, if you are female.  How is this diagnosed? This condition is diagnosed with a medical history and physical exam. You will also need to provide a urine sample to test your urine. Other tests may be done, including:  Blood tests.  Sexually transmitted disease (STD) testing.  If you have had more than one UTI, a  cystoscopy or imaging studies may be done to determine the cause of the infections. How is this treated? Treatment for this condition often includes a combination of two or more of the following:  Antibiotic medicine.  Other medicines to treat less common causes of UTI.  Over-the-counter medicines to treat pain.  Drinking enough water to stay hydrated.  Follow these instructions at home:  Take over-the-counter and prescription medicines only as told by your health care provider.  If you were prescribed an antibiotic, take it as told by your health care provider. Do not stop taking the antibiotic even if you start to feel better.  Avoid alcohol, caffeine, tea, and carbonated beverages. They can irritate your bladder.  Drink enough fluid to keep your urine clear or pale yellow.  Keep all follow-up visits as told by your health care provider. This is important.  Make sure to: ? Empty your bladder often and completely. Do not hold urine for long periods of time. ? Empty your bladder before and after sex. ? Wipe from front to back after a bowel movement if you are female. Use each tissue one time when you wipe. Contact a health care provider if:  You have back pain.  You have a fever.  You feel nauseous or vomit.  Your symptoms do not get better after 3 days.  Your symptoms go away and then return. Get help right away if:  You have severe back pain or lower abdominal pain.  You are vomiting and cannot keep down any medicines or water. This information is not intended to replace advice given to you by your health care provider. Make sure you discuss any questions you have with your health care provider. Document Released: 12/27/2004 Document Revised: 08/31/2015 Document Reviewed: 02/07/2015 Elsevier Interactive Patient Education  [employer]2018 [employer]vier Inc.

## 2017-06-24 NOTE — Progress Notes (Addendum)
Subjective:  By signing my name below, I, Pamela Roberts, attest that this documentation has been prepared under the direction and in the presence of Norberto Sorenson, MD Electronically Signed: Charline Bills, ED Scribe 06/24/2017 at 8:30 AM.   Patient ID: Pamela Roberts, female    DOB: 1960/09/19, 57 y.o.   MRN: [id number]  Chief Complaint  Patient presents with  . Back Pain    lower back, x1 week, Pt states she has a urine frequency. Pt states she has not noticed blood in her urine. Pt states she has slight abdominal pressure.  . Depression    Depression scale score 6   HPI Pamela Roberts is a 57 y.o. female who presents to Primary Care at [facility] complaining of urinary symptoms.   UTI Pt reports urinary frequency, mild twinge in L low flank with certain positions, some white vaginal discharge with a mild odor. 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 Denies hematuria, vaginal itching, fever, chills, nausea, vomiting, constipation, diarrhea, myalgias. Last UTI was last yr. Last pelvic exam was in Jan, diagnosed with vaginitis at that time. Denies STI screening; pt is not currently sexually active. Last STI screening was in December. No antibiotic allergies.  Depressed Mood Pt reports that her mood has been up and down for the past few months. States she recently found out that her brother was diagnosed with a very serious illness and she suspects that this is the cause of her depression. Pt has never used medication for depression in the past.  Pamela Roberts follows with specialists for medical care, primarily her endocrinologist Dr. Talmage Nap who manages her hypothyroidism. However, she does not have a PCP.   Current Outpatient Medications on File Prior to Visit  Medication Sig Dispense Refill  . levothyroxine (SYNTHROID, LEVOTHROID) 112 MCG tablet Take by mouth.    Marland Kitchen omeprazole (PRILOSEC) 40 MG capsule Take 1 capsule (40 mg total) by mouth 2 (two) times daily. 60 capsule 1    . azithromycin (ZITHROMAX) 250 MG tablet Take 2 tabs PO x 1 dose, then 1 tab PO QD x 4 days (Patient not taking: Reported on 06/24/2017) 6 tablet 0  . benzonatate (TESSALON) 100 MG capsule Take 1-2 capsules (100-200 mg total) by mouth 3 (three) times daily as needed for cough. (Patient not taking: Reported on 06/24/2017) 40 capsule 0  . cephALEXin (KEFLEX) 500 MG capsule Take 1 capsule (500 mg total) by mouth 3 (three) times daily. (Patient not taking: Reported on 06/24/2017) 21 capsule 0  . chlorpheniramine-HYDROcodone (TUSSIONEX PENNKINETIC ER) 10-8 MG/5ML SUER Take 5 mLs by mouth every 12 (twelve) hours as needed for cough. (Patient not taking: Reported on 06/24/2017) 100 mL 0  . Clobetasol Prop Emollient Base 0.05 % emollient cream Apply 1 application topically 2 (two) times daily. (Patient not taking: Reported on 06/24/2017) 30 g 1  . ipratropium (ATROVENT) 0.06 % nasal spray Place 2 sprays into both nostrils 4 (four) times daily. For nasal congestion (Patient not taking: Reported on 06/24/2017) 15 mL 0  . oseltamivir (TAMIFLU) 75 MG capsule Take 1 capsule (75 mg total) by mouth 2 (two) times daily. (Patient not taking: Reported on 06/24/2017) 10 capsule 0  . [DISCONTINUED] famotidine (PEPCID) 20 MG tablet One at bedtime 30 tablet 11   No current facility-administered medications on file prior to visit.    Past Medical History:  Diagnosis Date  . Hypothyroidism   . SOB (shortness of breath)    Past Surgical History:  Procedure Laterality Date  . ABDOMINAL HYSTERECTOMY    . CESAREAN SECTION W/BTL    . ECTOPIC PREGNANCY SURGERY     Allergies  Allergen Reactions  . Other Other (See Comments)    blisters   Family History  Problem Relation Age of Onset  . Asthma Brother   . Heart disease Mother   . Hypertension Mother   . Lung cancer Father        was a smoker  . Hypertension Sister   . Diabetes Brother    Social History   Socioeconomic History  . Marital status: Single    Spouse  name: Not on file  . Number of children: 1  . Years of education: Not on file  . Highest education level: Not on file  Occupational History  . Occupation: Pharmacy  Social Needs  . Financial resource strain: Not on file  . Food insecurity:    Worry: Not on file    Inability: Not on file  . Transportation needs:    Medical: Not on file    Non-medical: Not on file  Tobacco Use  . Smoking status: Never Smoker  . Smokeless tobacco: Never Used  Substance and Sexual Activity  . Alcohol use: No  . Drug use: No  . Sexual activity: Yes    Birth control/protection: Surgical  Lifestyle  . Physical activity:    Days per week: Not on file    Minutes per session: Not on file  . Stress: Not on file  Relationships  . Social connections:    Talks on phone: Not on file    Gets together: Not on file    Attends religious service: Not on file    Active member of club or organization: Not on file    Attends meetings of clubs or organizations: Not on file    Relationship status: Not on file  Other Topics Concern  . Not on file  Social History Narrative  . Not on file   Depression screen [facility] 2/9 06/24/2017 06/24/2017 05/22/2016 01/24/2015  Decreased Interest 3 0 0 0  Down, Depressed, Hopeless 0 0 0 3  PHQ - 2 Score 3 0 0 3  Altered sleeping 0 - - 2  Tired, decreased energy 3 - - 2  Change in appetite 0 - - 1  Feeling bad or failure about yourself  0 - - 1  Trouble concentrating 0 - - 0  Moving slowly or fidgety/restless 0 - - 0  Suicidal thoughts 0 - - 1  PHQ-9 Score 6 - - 10  Difficult doing work/chores Not difficult at all - - Somewhat difficult     Review of Systems  Constitutional: Negative for chills and fever.  Gastrointestinal: Negative for constipation, diarrhea and nausea.  Genitourinary: Positive for flank pain, frequency and vaginal discharge. Negative for hematuria.  Musculoskeletal: Negative for myalgias.  Psychiatric/Behavioral: Positive for dysphoric mood.        Objective:   Physical Exam  Constitutional: She is oriented to person, place, and time. She appears well-developed and well-nourished. No distress.  HENT:  Head: Normocephalic and atraumatic.  Eyes: Conjunctivae and EOM are normal.  Neck: Neck supple. No tracheal deviation present.  Cardiovascular: Normal rate, regular rhythm and normal heart sounds.  Pulmonary/Chest: Effort normal and breath sounds normal. No respiratory distress.  Abdominal: Soft. Bowel sounds are normal. She exhibits no distension. There is no hepatosplenomegaly. There is no tenderness. There is no CVA tenderness.  Musculoskeletal: Normal range of motion.  Neurological: She is alert and oriented to person, place, and time.  Skin: Skin is warm and dry.  Psychiatric: She has a normal mood and affect. Her behavior is normal.  Nursing note and vitals reviewed.  BP 110/80 (BP Location: Left Arm, Patient Position: Sitting, Cuff Size: Large)   Pulse 65   Temp 98.6 F (37 C) (Oral)   Resp 18   Ht 5\' 1"  (1.549 m)   Wt 230 lb 3.2 oz (104.4 kg)   LMP  (Exact Date)   SpO2 100%   BMI 43.50 kg/m     Results for orders placed or performed in visit on 06/24/17  POCT urinalysis dipstick  Result Value Ref Range   Color, UA yellow yellow   Clarity, UA cloudy (A) clear   Glucose, UA negative negative mg/dL   Bilirubin, UA negative negative   Ketones, POC UA negative negative mg/dL   Spec Grav, UA >=1.610>=1.030 (A) 1.010 - 1.025   Blood, UA negative negative   pH, UA 5.0 5.0 - 8.0   Protein Ur, POC negative negative mg/dL   Urobilinogen, UA 0.2 0.2 or 1.0 E.U./dL   Nitrite, UA Positive (A) Negative   Leukocytes, UA Small (1+) (A) Negative  POCT Microscopic Urinalysis ([facility])  Result Value Ref Range   WBC,UR,HPF,POC Too numerous to count  (A) None WBC/hpf   RBC,UR,HPF,POC None None RBC/hpf   Bacteria Many (A) None, Too numerous to count   Mucus Absent Absent   Epithelial Cells, UR Per Microscopy Few (A) None, Too numerous to  count cells/hpf  POCT Wet + KOH Prep  Result Value Ref Range   Yeast by KOH Absent Absent   Yeast by wet prep Absent Absent   WBC by wet prep Moderate (A) Few   Clue Cells Wet Prep HPF POC None None   Trich by wet prep Absent Absent   Bacteria Wet Prep HPF POC Many (A) Few   Epithelial Cells By Principal FinancialWet Pref ([facility]) Few None, Few, Too numerous to count   RBC,UR,HPF,POC None None RBC/hpf   Assessment & Plan:   1. Urinary tract infection symptoms  2. Acute cystitis without hematuria  - sxs and UA/micro c/w infxn. Keflex 500 tid x 1 wk, clx P.  3. Vaginal discharge - incidentally mentioned. Self-collect wet prep nml and declines STI screening as minimal risk.  Ok to call in diflucan 150mg  x1 if vaginal moniliasis sxs are triggered by abx.  If typical amine/"BV" odor and quantity of vaginal discharge cont to worsen during/after abx, ok to call in vag metrogel 1 applicator qhs x 5 nights, disp 70g, no refills  4.      Adjustment disorder with depressed mood - pt declines further eval or interest in any medical trx of mood sxs after depression screening sxs triggered discussion -pt sure that her sxs are a reaction to recent major change/decline in her brother's health, and h/o father passing away after battle w/ lung cancer.   Sched mammogram.  Orders Placed This Encounter  Procedures  . Urine Culture  . POCT urinalysis dipstick  . POCT Microscopic Urinalysis ([facility])  . POCT Wet + KOH Prep    Meds ordered this encounter  Medications  . cephALEXin (KEFLEX) 500 MG capsule    Sig: Take 1 capsule (500 mg total) by mouth 3 (three) times daily.    Dispense:  21 capsule    Refill:  0    I personally performed the services described in this documentation, which was  scribed in my presence. The recorded information has been reviewed and considered, and addended by me as needed.   Norberto Sorenson, M.D.  Primary Care at [facility] [address] [place], [place] [address] [phone]  phone [phone] fax  07/02/17 2:25 AM

## 2017-06-26 LAB — URINE CULTURE

## 2017-06-27 ENCOUNTER — Telehealth: Payer: Self-pay | Admitting: Family Medicine

## 2017-06-27 NOTE — Telephone Encounter (Signed)
Copied from CRM [phone]#[id number]. Topic: General - Other >> Jun 27, 2017  3:58 PM Gerrianne ScalePayne, Rayola Everhart L wrote: Reason for CRM: pt calling about labs

## 2017-06-27 NOTE — Telephone Encounter (Signed)
Pt given results per notes of Dr. Clelia CroftShaw on 06/26/17.Pt verbalized understanding. Unable to document in result note due to result note not being [facility].

## 2017-07-08 ENCOUNTER — Encounter: Payer: Self-pay | Admitting: *Deleted

## 2018-08-03 IMAGING — DX DG CHEST 2V
3 series · 3 of 3 positions shown · non-contrast
Comparison: None in PACs

CLINICAL DATA: Recently exposed to family member with pneumonia.
The patient reports cough and fever for 3 days.

EXAM:
CHEST  2 VIEW

[chest pa (1 of 2)]
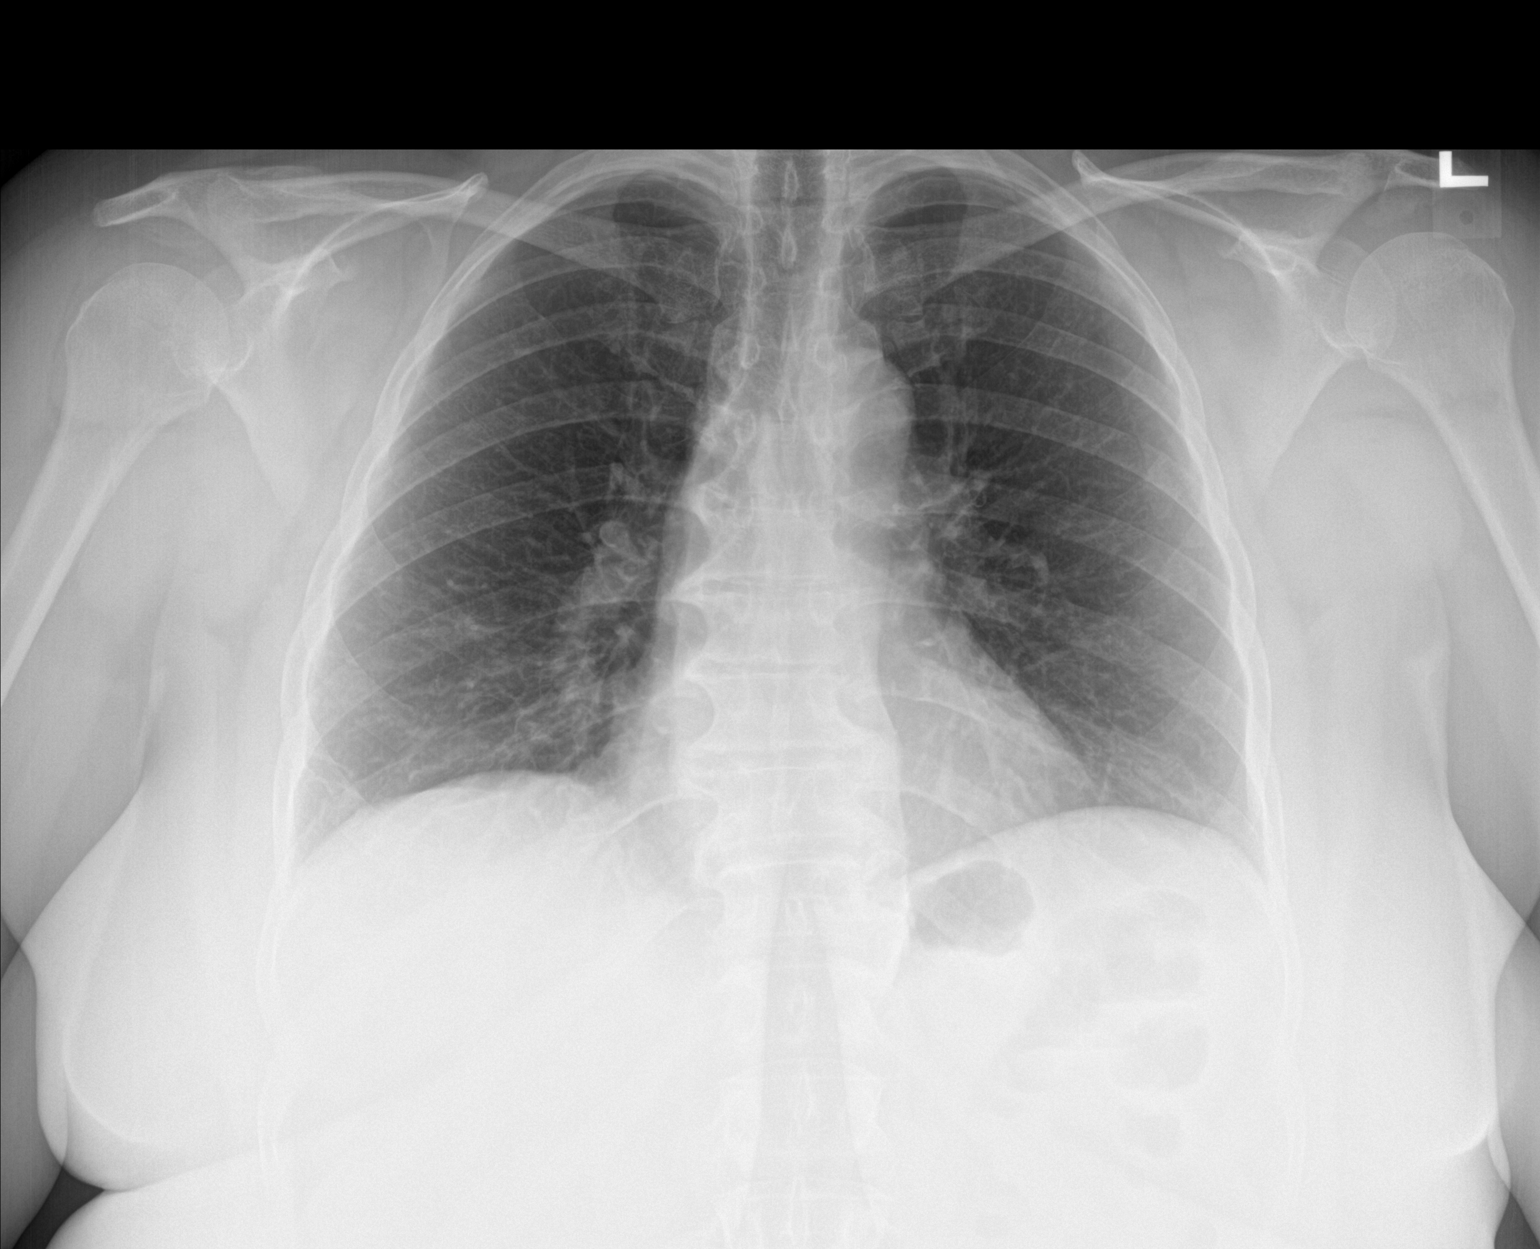

[chest lat]
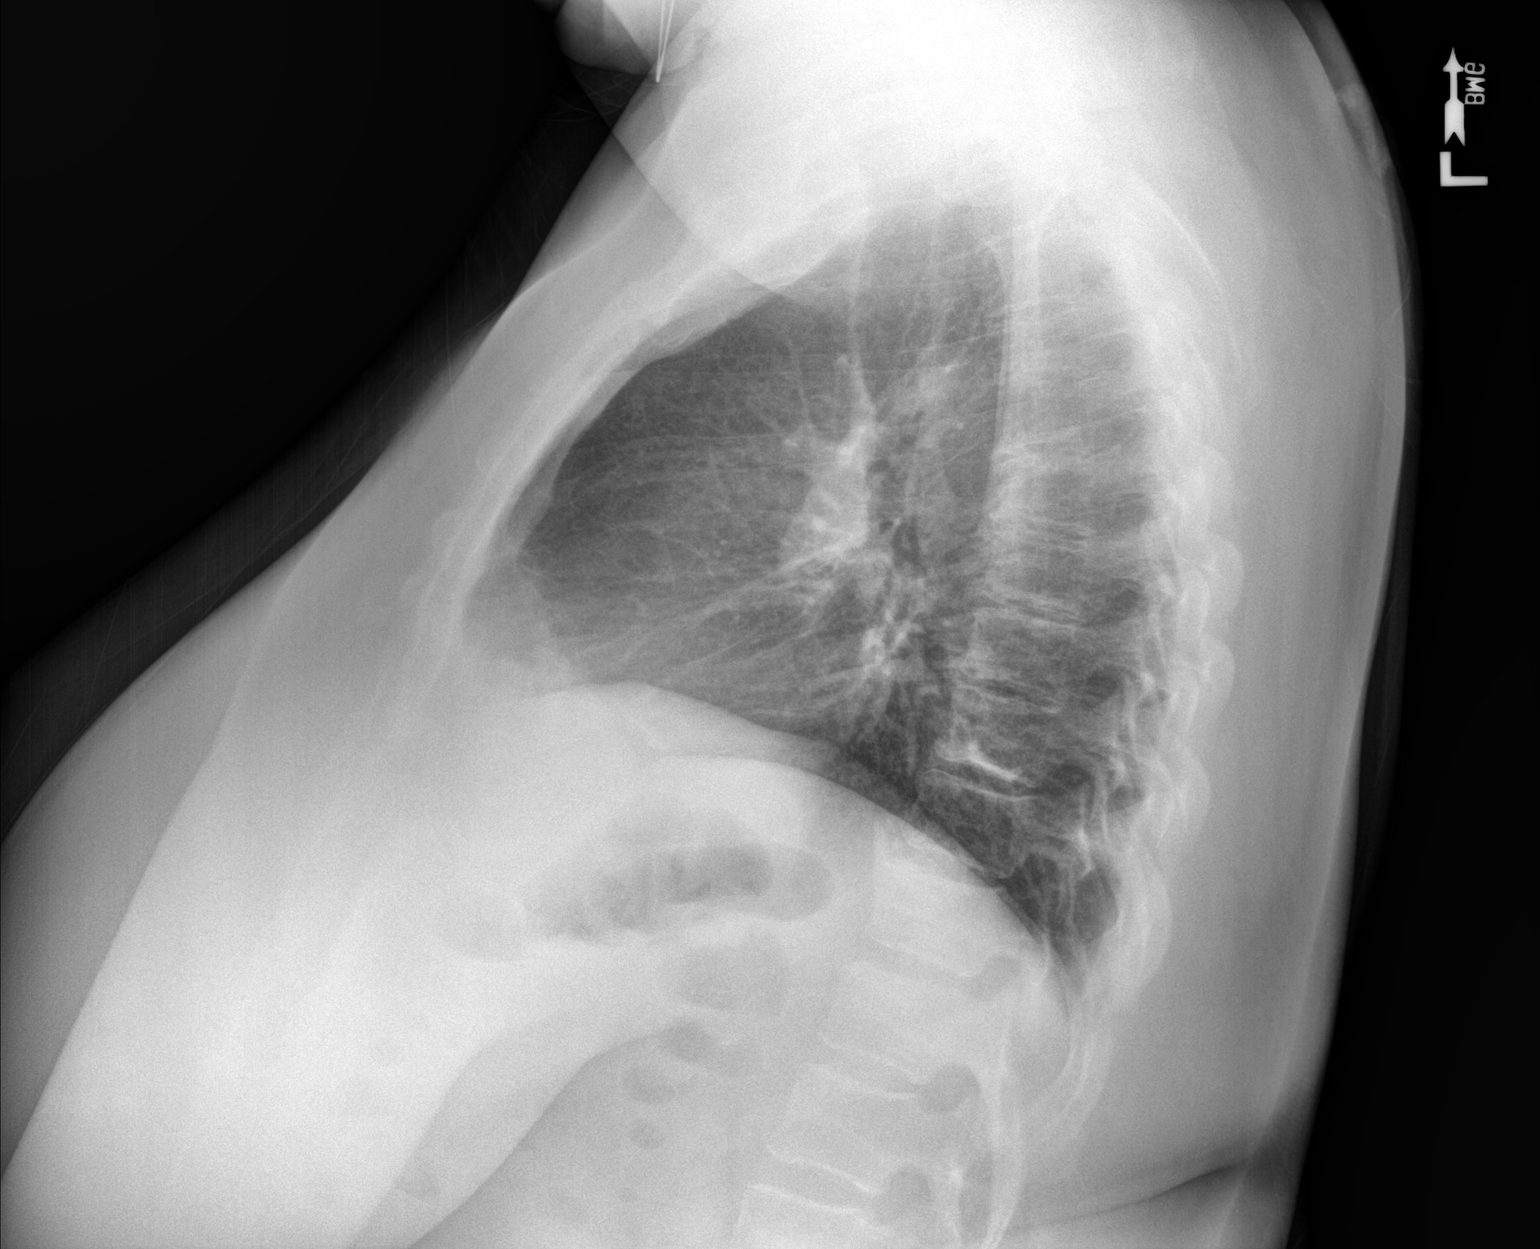

[chest pa (2 of 2)]
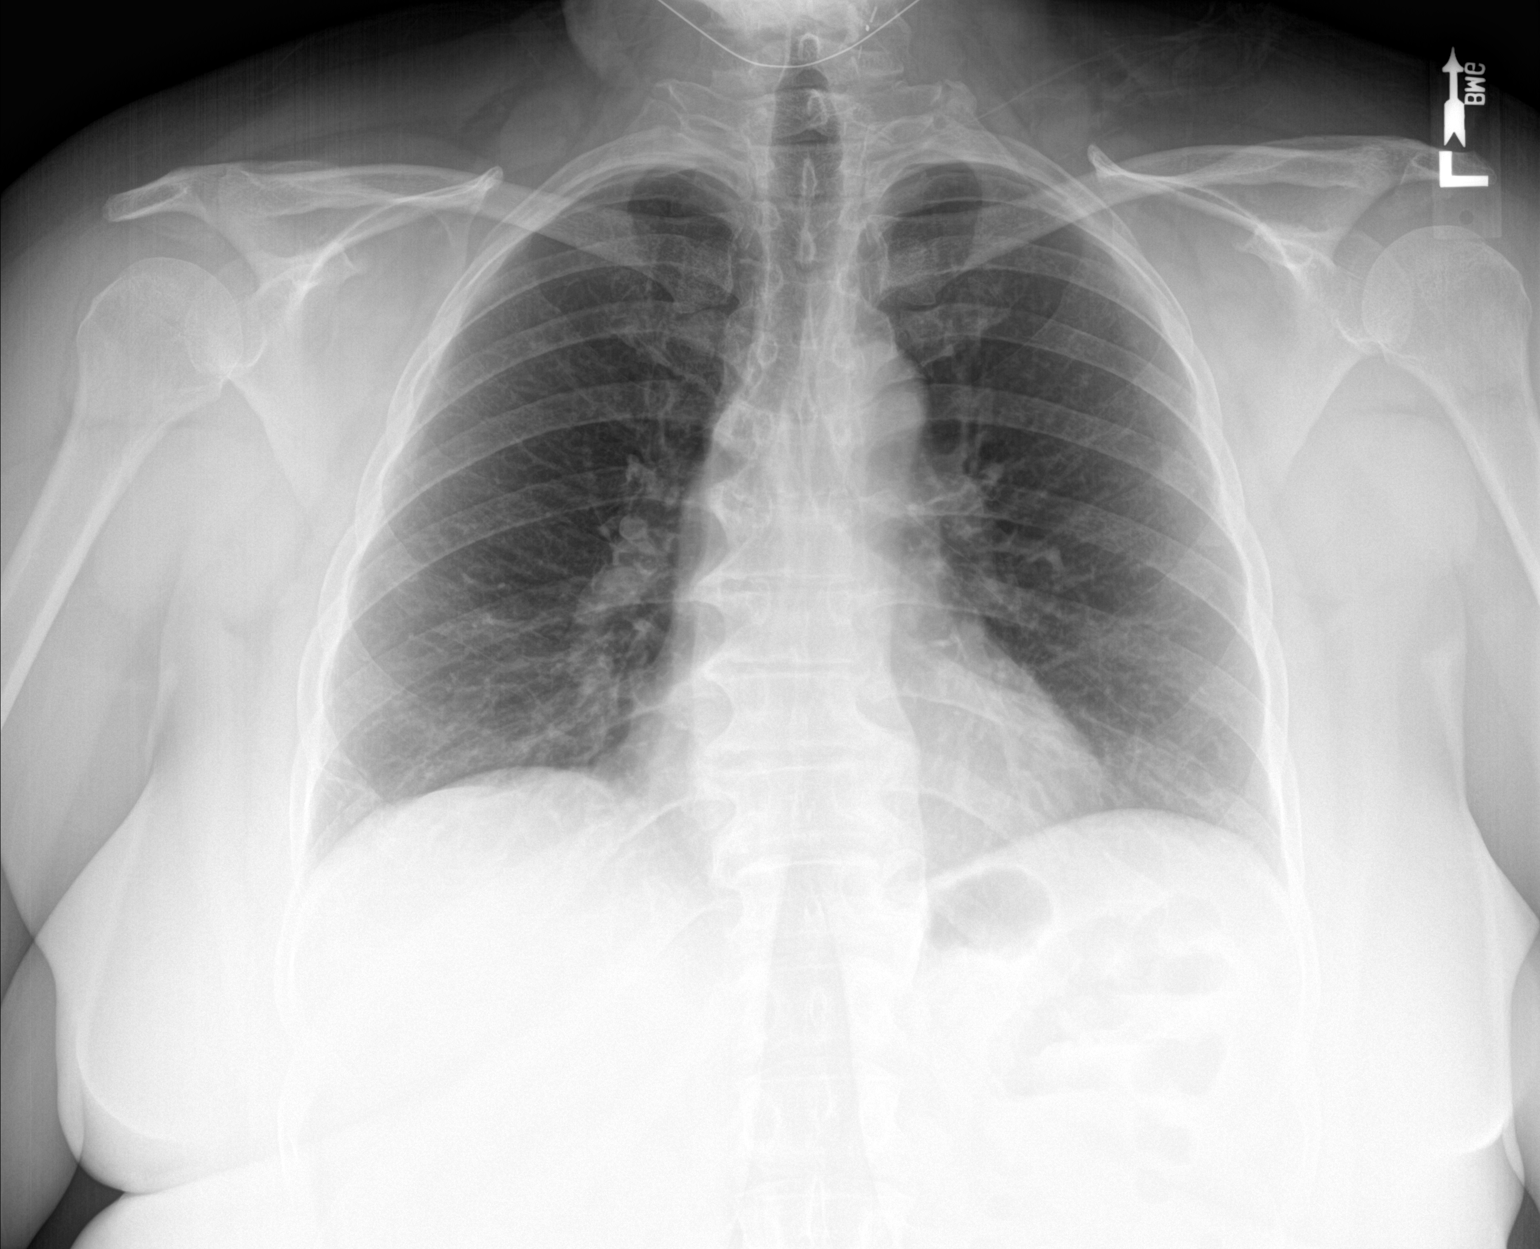

[3 of 3 positions shown; findings below may reference images not displayed]

FINDINGS: The lungs are well-expanded. There is no focal infiltrate. There is
no pleural effusion. Minimal linear density is noted at both lung
bases that likely reflect scarring or subsegmental atelectasis. The
heart and pulmonary vascularity are normal. The mediastinum is
normal in width. There is multilevel degenerative disc disease of
the thoracic spine with endplate osteophytes.
IMPRESSION: No acute pneumonia. Minimal subsegmental atelectasis or scarring at
the lung bases.

## 2020-04-11 ENCOUNTER — Encounter (HOSPITAL_COMMUNITY): Payer: Self-pay

## 2020-04-11 ENCOUNTER — Other Ambulatory Visit: Payer: Self-pay

## 2020-04-11 ENCOUNTER — Emergency Department (HOSPITAL_COMMUNITY)
Admission: EM | Admit: 2020-04-11 | Discharge: 2020-04-11 | Disposition: A | Discharge status: Home / Self Care | Payer: BC Managed Care – PPO | Attending: Emergency Medicine | Admitting: Emergency Medicine

## 2020-04-11 DIAGNOSIS — M7918 Myalgia, other site: Secondary | ICD-10-CM

## 2020-04-11 DIAGNOSIS — M25532 Pain in left wrist: Secondary | ICD-10-CM | POA: Diagnosis not present

## 2020-04-11 DIAGNOSIS — M25512 Pain in left shoulder: Secondary | ICD-10-CM | POA: Insufficient documentation

## 2020-04-11 DIAGNOSIS — R519 Headache, unspecified: Secondary | ICD-10-CM | POA: Diagnosis not present

## 2020-04-11 DIAGNOSIS — Z79899 Other long term (current) drug therapy: Secondary | ICD-10-CM | POA: Insufficient documentation

## 2020-04-11 DIAGNOSIS — Y92410 Unspecified street and highway as the place of occurrence of the external cause: Secondary | ICD-10-CM | POA: Diagnosis not present

## 2020-04-11 DIAGNOSIS — Z20822 Contact with and (suspected) exposure to covid-19: Secondary | ICD-10-CM | POA: Diagnosis not present

## 2020-04-11 DIAGNOSIS — E039 Hypothyroidism, unspecified: Secondary | ICD-10-CM | POA: Insufficient documentation

## 2020-04-11 MED ORDER — METHOCARBAMOL 500 MG PO TABS: 500.0000 mg | ORAL_TABLET | Freq: Two times a day (BID) | ORAL | 0 refills | Status: DC
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
## 2020-04-11 NOTE — ED Notes (Signed)
An After Visit Summary was printed and given to the patient. Discharge instructions given and no further questions at this time.  
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
## 2020-04-11 NOTE — Progress Notes (Signed)
Orthopedic Tech Progress Note Patient Details:  Pamela Roberts 1960-06-30 [id number]  Ortho Devices Ortho Device/Splint Location: applied velcro wrist splint to LUE Ortho Device/Splint Interventions: Ordered,Application   Post Interventions Patient Tolerated: Well Instructions Provided: Care of device   Jennye Moccasin 04/11/2020, 9:22 PM

## 2020-04-12 DIAGNOSIS — A6009 Herpesviral infection of other urogenital tract: Secondary | ICD-10-CM | POA: Diagnosis not present

## 2020-04-12 DIAGNOSIS — Z90710 Acquired absence of both cervix and uterus: Secondary | ICD-10-CM | POA: Diagnosis not present

## 2020-04-12 DIAGNOSIS — Z113 Encounter for screening for infections with a predominantly sexual mode of transmission: Secondary | ICD-10-CM | POA: Diagnosis not present

## 2020-04-12 DIAGNOSIS — Z01419 Encounter for gynecological examination (general) (routine) without abnormal findings: Secondary | ICD-10-CM | POA: Diagnosis not present

## 2020-04-12 LAB — SARS CORONAVIRUS 2 (TAT 6-24 HRS): SARS Coronavirus 2: NEGATIVE

## 2021-02-17 DIAGNOSIS — E669 Obesity, unspecified: Secondary | ICD-10-CM | POA: Diagnosis not present

## 2021-02-17 DIAGNOSIS — E039 Hypothyroidism, unspecified: Secondary | ICD-10-CM | POA: Diagnosis not present

## 2021-02-21 ENCOUNTER — Ambulatory Visit (INDEPENDENT_AMBULATORY_CARE_PROVIDER_SITE_OTHER): Payer: BC Managed Care – PPO

## 2021-02-21 ENCOUNTER — Other Ambulatory Visit: Payer: Self-pay

## 2021-02-21 ENCOUNTER — Encounter (HOSPITAL_COMMUNITY): Payer: Self-pay | Admitting: Emergency Medicine

## 2021-02-21 ENCOUNTER — Ambulatory Visit (HOSPITAL_COMMUNITY)
Admission: EM | Admit: 2021-02-21 | Discharge: 2021-02-21 | Disposition: A | Discharge status: Home / Self Care | Payer: BC Managed Care – PPO | Attending: Urgent Care | Admitting: Urgent Care

## 2021-02-21 DIAGNOSIS — J069 Acute upper respiratory infection, unspecified: Secondary | ICD-10-CM | POA: Diagnosis not present

## 2021-02-21 DIAGNOSIS — R6889 Other general symptoms and signs: Secondary | ICD-10-CM

## 2021-02-21 DIAGNOSIS — R0602 Shortness of breath: Secondary | ICD-10-CM

## 2021-02-21 DIAGNOSIS — R059 Cough, unspecified: Secondary | ICD-10-CM | POA: Diagnosis not present

## 2021-02-21 MED ORDER — CETIRIZINE HCL 10 MG PO TABS: 10.0000 mg | ORAL_TABLET | Freq: Every day | ORAL | 0 refills | Status: DC

## 2021-02-21 MED ORDER — PSEUDOEPHEDRINE HCL 60 MG PO TABS: 60.0000 mg | ORAL_TABLET | Freq: Three times a day (TID) | ORAL | 0 refills | Status: DC | PRN

## 2021-02-21 MED ORDER — PROMETHAZINE-DM 6.25-15 MG/5ML PO SYRP: 5.0000 mL | ORAL_SOLUTION | Freq: Every evening | ORAL | 0 refills | Status: DC | PRN

## 2021-02-21 MED ORDER — BENZONATATE 100 MG PO CAPS: 100.0000 mg | ORAL_CAPSULE | Freq: Three times a day (TID) | ORAL | 0 refills | Status: DC | PRN

## 2021-02-21 NOTE — ED Provider Notes (Signed)
[facility]   MRN: [id number] DOB: Feb 05, 1961  Subjective:   Pamela Roberts is a 60 y.o. female presenting for 4-day history of acute onset throat pain, congestion, cough, rattling in her chest and wheezing.  No history of asthma, COPD.  Patient is not a smoker.  She does have a history of shortness of breath.  Denies history of pulmonary embolism.  She does have a history of allergies yearly. Has a history pneumonia.   No current facility-administered medications for this encounter.  Current Outpatient Medications:    cephALEXin (KEFLEX) 500 MG capsule, Take 1 capsule (500 mg total) by mouth 3 (three) times daily., Disp: 21 capsule, Rfl: 0   chlorpheniramine-HYDROcodone (TUSSIONEX PENNKINETIC ER) 10-8 MG/5ML SUER, Take 5 mLs by mouth every 12 (twelve) hours as needed for cough. (Patient not taking: Reported on 06/24/2017), Disp: 100 mL, Rfl: 0   levothyroxine (SYNTHROID, LEVOTHROID) 112 MCG tablet, Take by mouth., Disp: , Rfl:    methocarbamol (ROBAXIN) 500 MG tablet, Take 1 tablet (500 mg total) by mouth 2 (two) times daily., Disp: 20 tablet, Rfl: 0   omeprazole (PRILOSEC) 40 MG capsule, Take 1 capsule (40 mg total) by mouth 2 (two) times daily., Disp: 60 capsule, Rfl: 1   Allergies  Allergen Reactions   Other Other (See Comments)    blisters    Past Medical History:  Diagnosis Date   Hypothyroidism    SOB (shortness of breath)      Past Surgical History:  Procedure Laterality Date   ABDOMINAL HYSTERECTOMY     CESAREAN SECTION W/BTL     ECTOPIC PREGNANCY SURGERY      Family History  Problem Relation Age of Onset   Asthma Brother    Heart disease Mother    Hypertension Mother    Lung cancer Father        was a smoker   Hypertension Sister    Diabetes Brother     Social History   Tobacco Use   Smoking status: Never   Smokeless tobacco: Never  Substance Use Topics   Alcohol use: No   Drug use: No    ROS   Objective:   Vitals: BP 133/83  (BP Location: Right Arm)   Pulse 78   Temp 98.3 F (36.8 C) (Oral)   Resp 17   SpO2 98%   Physical Exam Constitutional:      General: She is not in acute distress.    Appearance: Normal appearance. She is well-developed. She is not ill-appearing, toxic-appearing or diaphoretic.  HENT:     Head: Normocephalic and atraumatic.     Right Ear: Tympanic membrane, ear canal and external ear normal. No drainage or tenderness. No middle ear effusion. Tympanic membrane is not erythematous.     Left Ear: Tympanic membrane, ear canal and external ear normal. No drainage or tenderness.  No middle ear effusion. Tympanic membrane is not erythematous.     Nose: Congestion present. No rhinorrhea.     Mouth/Throat:     Mouth: Mucous membranes are moist. No oral lesions.     Pharynx: No pharyngeal swelling, oropharyngeal exudate, posterior oropharyngeal erythema or uvula swelling.     Tonsils: No tonsillar exudate or tonsillar abscesses.  Eyes:     General: No scleral icterus.       Right eye: No discharge.        Left eye: No discharge.     Extraocular Movements: Extraocular movements intact.     Right  eye: Normal extraocular motion.     Left eye: Normal extraocular motion.     Conjunctiva/sclera: Conjunctivae normal.     Pupils: Pupils are equal, round, and reactive to light.  Cardiovascular:     Rate and Rhythm: Normal rate and regular rhythm.     Pulses: Normal pulses.     Heart sounds: Normal heart sounds. No murmur heard.   No friction rub. No gallop.  Pulmonary:     Effort: Pulmonary effort is normal. No respiratory distress.     Breath sounds: Normal breath sounds. No stridor. No wheezing, rhonchi or rales.  Musculoskeletal:     Cervical back: Normal range of motion and neck supple.  Lymphadenopathy:     Cervical: No cervical adenopathy.  Skin:    General: Skin is warm and dry.     Findings: No rash.  Neurological:     General: No focal deficit present.     Mental Status: She is  alert and oriented to person, place, and time.  Psychiatric:        Mood and Affect: Mood normal.        Behavior: Behavior normal.        Thought Content: Thought content normal.    DG Chest 2 View  Result Date: 02/21/2021 CLINICAL DATA:  Cough, shortness of breath EXAM: CHEST - 2 VIEW COMPARISON:  Previous studies including the examination of 05/22/2016 FINDINGS: Cardiac size is within normal limits. There are no signs of pulmonary edema or focal pulmonary consolidation. There is interval resolution of small linear densities in the periphery of right lower lung fields. There is small faint transverse linear density in the left lower lung fields adjacent to the cardiophrenic angle. There is no significant pleural effusion or pneumothorax. IMPRESSION: There are no signs of pulmonary edema or focal pulmonary consolidation. Small transverse linear density in the left lower lung fields adjacent to the left cardiophrenic angle may suggest scarring or subsegmental atelectasis. Electronically Signed   By: Ernie Avena M.D.   On: 02/21/2021 09:59    Assessment and Plan :   PDMP not reviewed this encounter.  1. Viral URI with cough   2. Throat congestion     Suspect that the linear density is scarring from her history of pneumonia.  Recommended supportive care.  Offered respiratory panel but the patient refused. Offered prednisone and albuterol for her shob but she declined this as well. Counseled patient on potential for adverse effects with medications prescribed/recommended today, ER and return-to-clinic precautions discussed, patient verbalized understanding.    Wallis Bamberg, PA-C 02/21/21 1139

## 2021-02-21 NOTE — ED Triage Notes (Signed)
Pt c/o sore throat, congestion, cough, wheezing since Thursday-Friday. Reports that has urinated some on herself. Chest hurts with coughing. Getting up phlegm.

## 2021-05-18 DIAGNOSIS — R1905 Periumbilic swelling, mass or lump: Secondary | ICD-10-CM | POA: Diagnosis not present

## 2021-05-18 DIAGNOSIS — R1010 Upper abdominal pain, unspecified: Secondary | ICD-10-CM | POA: Diagnosis not present

## 2021-05-26 DIAGNOSIS — R1010 Upper abdominal pain, unspecified: Secondary | ICD-10-CM | POA: Diagnosis not present

## 2021-05-26 DIAGNOSIS — K760 Fatty (change of) liver, not elsewhere classified: Secondary | ICD-10-CM | POA: Diagnosis not present

## 2021-05-26 DIAGNOSIS — R1013 Epigastric pain: Secondary | ICD-10-CM | POA: Diagnosis not present

## 2021-05-26 DIAGNOSIS — D7389 Other diseases of spleen: Secondary | ICD-10-CM | POA: Diagnosis not present

## 2021-05-26 DIAGNOSIS — K439 Ventral hernia without obstruction or gangrene: Secondary | ICD-10-CM | POA: Diagnosis not present

## 2021-05-26 DIAGNOSIS — R1905 Periumbilic swelling, mass or lump: Secondary | ICD-10-CM | POA: Diagnosis not present

## 2021-06-01 DIAGNOSIS — Z90710 Acquired absence of both cervix and uterus: Secondary | ICD-10-CM | POA: Diagnosis not present

## 2021-06-01 DIAGNOSIS — F4320 Adjustment disorder, unspecified: Secondary | ICD-10-CM | POA: Diagnosis not present

## 2021-06-01 DIAGNOSIS — A6000 Herpesviral infection of urogenital system, unspecified: Secondary | ICD-10-CM | POA: Diagnosis not present

## 2021-06-01 DIAGNOSIS — K429 Umbilical hernia without obstruction or gangrene: Secondary | ICD-10-CM | POA: Diagnosis not present

## 2021-06-01 DIAGNOSIS — B3731 Acute candidiasis of vulva and vagina: Secondary | ICD-10-CM | POA: Diagnosis not present

## 2021-06-01 DIAGNOSIS — Z01411 Encounter for gynecological examination (general) (routine) with abnormal findings: Secondary | ICD-10-CM | POA: Diagnosis not present

## 2021-06-01 DIAGNOSIS — Z6841 Body Mass Index (BMI) 40.0 and over, adult: Secondary | ICD-10-CM | POA: Diagnosis not present

## 2021-06-01 DIAGNOSIS — Z113 Encounter for screening for infections with a predominantly sexual mode of transmission: Secondary | ICD-10-CM | POA: Diagnosis not present

## 2021-06-01 DIAGNOSIS — K439 Ventral hernia without obstruction or gangrene: Secondary | ICD-10-CM | POA: Diagnosis not present

## 2021-06-14 DIAGNOSIS — Z01810 Encounter for preprocedural cardiovascular examination: Secondary | ICD-10-CM | POA: Diagnosis not present

## 2021-06-14 DIAGNOSIS — K439 Ventral hernia without obstruction or gangrene: Secondary | ICD-10-CM | POA: Diagnosis not present

## 2021-06-14 DIAGNOSIS — K429 Umbilical hernia without obstruction or gangrene: Secondary | ICD-10-CM | POA: Diagnosis not present

## 2021-06-14 DIAGNOSIS — I491 Atrial premature depolarization: Secondary | ICD-10-CM | POA: Diagnosis not present

## 2021-06-14 DIAGNOSIS — Z01812 Encounter for preprocedural laboratory examination: Secondary | ICD-10-CM | POA: Diagnosis not present

## 2021-06-16 DIAGNOSIS — K439 Ventral hernia without obstruction or gangrene: Secondary | ICD-10-CM | POA: Diagnosis not present

## 2021-06-16 DIAGNOSIS — K429 Umbilical hernia without obstruction or gangrene: Secondary | ICD-10-CM | POA: Diagnosis not present

## 2021-06-16 DIAGNOSIS — K432 Incisional hernia without obstruction or gangrene: Secondary | ICD-10-CM | POA: Diagnosis not present

## 2021-06-16 DIAGNOSIS — K430 Incisional hernia with obstruction, without gangrene: Secondary | ICD-10-CM | POA: Diagnosis not present

## 2021-07-06 DIAGNOSIS — Z8719 Personal history of other diseases of the digestive system: Secondary | ICD-10-CM | POA: Diagnosis not present

## 2021-07-06 DIAGNOSIS — Z9889 Other specified postprocedural states: Secondary | ICD-10-CM | POA: Diagnosis not present

## 2021-07-06 DIAGNOSIS — Z09 Encounter for follow-up examination after completed treatment for conditions other than malignant neoplasm: Secondary | ICD-10-CM | POA: Diagnosis not present

## 2022-02-14 DIAGNOSIS — E039 Hypothyroidism, unspecified: Secondary | ICD-10-CM | POA: Diagnosis not present

## 2022-02-21 DIAGNOSIS — E669 Obesity, unspecified: Secondary | ICD-10-CM | POA: Diagnosis not present

## 2022-02-21 DIAGNOSIS — E039 Hypothyroidism, unspecified: Secondary | ICD-10-CM | POA: Diagnosis not present

## 2022-04-18 ENCOUNTER — Ambulatory Visit (HOSPITAL_COMMUNITY)
Admission: EM | Admit: 2022-04-18 | Discharge: 2022-04-18 | Disposition: A | Discharge status: Home / Self Care | Payer: BC Managed Care – PPO | Attending: Family Medicine | Admitting: Family Medicine

## 2022-04-18 DIAGNOSIS — J069 Acute upper respiratory infection, unspecified: Secondary | ICD-10-CM | POA: Diagnosis not present

## 2022-04-18 DIAGNOSIS — R062 Wheezing: Secondary | ICD-10-CM

## 2022-04-18 MED ORDER — HYDROCODONE BIT-HOMATROP MBR 5-1.5 MG/5ML PO SOLN: 5.0000 mL | Freq: Four times a day (QID) | ORAL | 0 refills | Status: AC | PRN

## 2022-04-18 MED ORDER — PREDNISONE 20 MG PO TABS: 40.0000 mg | ORAL_TABLET | Freq: Every day | ORAL | 0 refills | Status: DC

## 2022-04-18 NOTE — Discharge Instructions (Signed)
Be aware, your cough medication may cause drowsiness. Please do not drive, operate heavy machinery or make important decisions while on this medication as it may cloud your judgement.  

## 2022-04-18 NOTE — ED Triage Notes (Signed)
Pt is here for cough, nasal congestion, runny nose, abdominal pain, diarrhea, sneezing, body aches  chills, loss of appetite, neck pain 5days

## 2022-04-19 NOTE — ED Provider Notes (Signed)
  [place]   [id number] 04/18/22 Arrival Time: [place]:  1. Viral URI with cough   2. Wheezing    Discussed typical duration of likely viral illness. No resp distress. OTC symptom care as needed.  Discharge Medication List as of 04/18/2022  5:07 PM     START taking these medications   Details  HYDROcodone bit-homatropine (HYCODAN) 5-1.5 MG/5ML syrup Take 5 mLs by mouth every 6 (six) hours as needed for cough., Starting Wed 04/18/2022, Normal    predniSONE (DELTASONE) 20 MG tablet Take 2 tablets (40 mg total) by mouth daily., Starting Wed 04/18/2022, Normal         Follow-up Information     [facility].   Specialty: Urgent Care Why: If worsening or failing to improve as anticipated. Contact information: [place] [phone]                Reviewed expectations re: course of current medical issues. Questions answered. Outlined signs and symptoms indicating need for more acute intervention. Understanding verbalized. After Visit Summary given.   SUBJECTIVE: History from: Patient. Pamela Roberts is a 62 y.o. female. Reports: cough, nasal congestion, runny nose, mild loose stool today, sneezing, body aches; ques wheezing at times. No SOB. Denies: fever. Normal PO intake without n/v/d.  OBJECTIVE:  Vitals:   04/18/22 1641  BP: 106/74  Pulse: 70  Resp: 12  Temp: 98.5 F (36.9 C)  TempSrc: Oral  SpO2: 98%    General appearance: alert; no distress Eyes: PERRLA; EOMI; conjunctiva normal HENT: [place]; AT; with nasal congestion Neck: supple  Lungs: speaks full sentences without difficulty; unlabored; mod bilat exp wheezing Extremities: no edema Skin: warm and dry Neurologic: normal gait Psychological: alert and cooperative; normal mood and affect   Allergies  Allergen Reactions   Other Other (See Comments)    blisters    Past Medical History:  Diagnosis  Date   Hypothyroidism    SOB (shortness of breath)    Social History   Socioeconomic History   Marital status: Single    Spouse name: Not on file   Number of children: 1   Years of education: Not on file   Highest education level: Not on file  Occupational History   Occupation: Pharmacy  Tobacco Use   Smoking status: Never   Smokeless tobacco: Never  Substance and Sexual Activity   Alcohol use: No   Drug use: No   Sexual activity: Yes    Birth control/protection: Surgical  Other Topics Concern   Not on file  Social History Narrative   Not on file   Social Determinants of Health   Financial Resource Strain: Not on file  Food Insecurity: Not on file  Transportation Needs: Not on file  Physical Activity: Not on file  Stress: Not on file  Social Connections: Not on file  Intimate Partner Violence: Not on file   Family History  Problem Relation Age of Onset   Asthma Brother    Heart disease Mother    Hypertension Mother    Lung cancer Father        was a smoker   Hypertension Sister    Diabetes Brother    Past Surgical History:  Procedure Laterality Date   ABDOMINAL HYSTERECTOMY     CESAREAN SECTION W/BTL     ECTOPIC PREGNANCY SURGERY       Vanessa Kick, MD 04/19/22 [phone]

## 2022-04-27 DIAGNOSIS — S2002XA Contusion of left breast, initial encounter: Secondary | ICD-10-CM | POA: Diagnosis not present

## 2022-04-27 DIAGNOSIS — S2001XA Contusion of right breast, initial encounter: Secondary | ICD-10-CM | POA: Diagnosis not present

## 2022-04-27 DIAGNOSIS — R928 Other abnormal and inconclusive findings on diagnostic imaging of breast: Secondary | ICD-10-CM | POA: Diagnosis not present

## 2022-04-27 DIAGNOSIS — N6489 Other specified disorders of breast: Secondary | ICD-10-CM | POA: Diagnosis not present

## 2022-12-31 DIAGNOSIS — S83242A Other tear of medial meniscus, current injury, left knee, initial encounter: Secondary | ICD-10-CM | POA: Diagnosis not present

## 2022-12-31 DIAGNOSIS — M1712 Unilateral primary osteoarthritis, left knee: Secondary | ICD-10-CM | POA: Diagnosis not present

## 2023-03-04 DIAGNOSIS — E039 Hypothyroidism, unspecified: Secondary | ICD-10-CM | POA: Diagnosis not present

## 2023-03-04 DIAGNOSIS — E669 Obesity, unspecified: Secondary | ICD-10-CM | POA: Diagnosis not present

## 2023-03-08 DIAGNOSIS — E1165 Type 2 diabetes mellitus with hyperglycemia: Secondary | ICD-10-CM | POA: Diagnosis not present

## 2023-03-08 DIAGNOSIS — E669 Obesity, unspecified: Secondary | ICD-10-CM | POA: Diagnosis not present

## 2023-03-08 DIAGNOSIS — E039 Hypothyroidism, unspecified: Secondary | ICD-10-CM | POA: Diagnosis not present

## 2023-05-24 ENCOUNTER — Ambulatory Visit (HOSPITAL_COMMUNITY)
Admission: EM | Admit: 2023-05-24 | Discharge: 2023-05-24 | Disposition: A | Discharge status: Home / Self Care | Payer: BC Managed Care – PPO | Attending: Physician Assistant | Admitting: Physician Assistant

## 2023-05-24 ENCOUNTER — Encounter (HOSPITAL_COMMUNITY): Payer: Self-pay

## 2023-05-24 DIAGNOSIS — J019 Acute sinusitis, unspecified: Secondary | ICD-10-CM

## 2023-05-24 DIAGNOSIS — B9689 Other specified bacterial agents as the cause of diseases classified elsewhere: Secondary | ICD-10-CM | POA: Diagnosis not present

## 2023-05-24 MED ORDER — AMOXICILLIN-POT CLAVULANATE 875-125 MG PO TABS: 1.0000 | ORAL_TABLET | Freq: Two times a day (BID) | ORAL | 0 refills | Status: AC

## 2023-05-24 MED ORDER — PREDNISONE 20 MG PO TABS: 40.0000 mg | ORAL_TABLET | Freq: Every day | ORAL | 0 refills | Status: AC

## 2023-05-24 NOTE — Discharge Instructions (Addendum)
Based on your symptoms and duration of illness, I believe you may have a bacterial sinus infection  These typically resolve with antibiotic therapy along with at-home comfort measures  Today I have sent in a prescription for Augmentin 875-125 mg to be taken by mouth twice per day for 7 days FINISH THE ENTIRE COURSE unless you develop an allergic reaction or are instructed to discontinue.  It can take a few days for the antibiotic to kick in so I recommend symptomatic relief with over the counter medication such as the following:  Dayquil/ Nyquil Theraflu Alkaseltzer  Coricidin - if you have high blood pressure even if it is well managed with medications You can continue the Claritin and I recommend adding Flonase as well to help with the nasal congestion and ear fullness   These medications typically have Tylenol in them already do not need to supplement with more outside of those medications  Stay well hydrated with at least 75 oz of water per day to help with recovery  If you notice any of the following please go to the ED: increased fever not responding to Tylenol or Ibuprofen, swelling around your nose or eyes, difficulty seeing, shortness of breath or trouble breathing.

## 2023-05-24 NOTE — ED Provider Notes (Signed)
[facility]    CSN: [id number] Arrival date & time: 05/24/23  9147      History   Chief Complaint Chief Complaint  Patient presents with   Fever    HPI Pamela Roberts is a 63 y.o. female.   HPI  She reports she has had productive cough, hot flashes, chills, nasal congestion, sore throat for the past week  She reports fever has largely resolved but she did have one last week  Interventions: Claritin, Nyquil to help with sleep  She thinks a few people at work have been sick as well   Past Medical History:  Diagnosis Date   Hypothyroidism    SOB (shortness of breath)     Patient Active Problem List   Diagnosis Date Noted   Dyspnea 08/02/2011    Past Surgical History:  Procedure Laterality Date   ABDOMINAL HYSTERECTOMY     CESAREAN SECTION W/BTL     ECTOPIC PREGNANCY SURGERY      OB History   No obstetric history on file.      Home Medications    Prior to Admission medications   Medication Sig Start Date End Date Taking? Authorizing Provider  amoxicillin-clavulanate (AUGMENTIN) 875-125 MG tablet Take 1 tablet by mouth every 12 (twelve) hours. 05/24/23  Yes Martyn Timme E, PA-C  predniSONE (DELTASONE) 20 MG tablet Take 2 tablets (40 mg total) by mouth daily for 5 days. 05/24/23 05/29/23 Yes Minor Iden E, PA-C  HYDROcodone bit-homatropine (HYCODAN) 5-1.5 MG/5ML syrup Take 5 mLs by mouth every 6 (six) hours as needed for cough. 04/18/22   Mardella Layman, MD  levothyroxine (SYNTHROID, LEVOTHROID) 112 MCG tablet Take by mouth.    [provider]  omeprazole (PRILOSEC) 40 MG capsule Take 1 capsule (40 mg total) by mouth 2 (two) times daily. 05/27/13   Rodolph Bong, MD  famotidine (PEPCID) 20 MG tablet One at bedtime 07/31/11 05/27/13  Nyoka Cowden, MD    Family History Family History  Problem Relation Age of Onset   Asthma Brother    Heart disease Mother    Hypertension Mother    Lung cancer Father        was a smoker   Hypertension Sister     Diabetes Brother     Social History Social History   Tobacco Use   Smoking status: Never   Smokeless tobacco: Never  Substance Use Topics   Alcohol use: No   Drug use: No     Allergies   Other   Review of Systems Review of Systems  Constitutional:  Positive for chills, diaphoresis, fatigue and fever.  HENT:  Positive for congestion, postnasal drip, rhinorrhea and sore throat. Negative for ear pain.   Respiratory:  Positive for cough. Negative for shortness of breath and wheezing.   Gastrointestinal:  Positive for diarrhea, nausea and vomiting.  Musculoskeletal:  Positive for myalgias.  Neurological:  Positive for dizziness and headaches.     Physical Exam Triage Vital Signs ED Triage Vitals  Encounter Vitals Group     BP 05/24/23 0836 127/79     Systolic BP Percentile --      Diastolic BP Percentile --      Pulse Rate 05/24/23 0833 75     Resp 05/24/23 0833 16     Temp 05/24/23 0833 98 F (36.7 C)     Temp Source 05/24/23 0833 Oral     SpO2 05/24/23 0833 96 %     Weight --  Height --      Head Circumference --      Peak Flow --      Pain Score 05/24/23 0834 0     Pain Loc --      Pain Education --      Exclude from Growth Chart --    No data found.  Updated Vital Signs BP 127/79 (BP Location: Left Arm)   Pulse 75   Temp 98 F (36.7 C) (Oral)   Resp 16   SpO2 96%   Visual Acuity Right Eye Distance:   Left Eye Distance:   Bilateral Distance:    Right Eye Near:   Left Eye Near:    Bilateral Near:     Physical Exam Vitals reviewed.  Constitutional:      General: She is awake.     Appearance: She is well-developed and well-groomed. She is ill-appearing.  HENT:     Head: Normocephalic and atraumatic.     Right Ear: Hearing and ear canal normal. Tympanic membrane is erythematous.     Left Ear: Hearing and ear canal normal. Tympanic membrane is erythematous.     Mouth/Throat:     Lips: Pink.     Mouth: Mucous membranes are moist.      Pharynx: Uvula midline. Posterior oropharyngeal erythema present.  Cardiovascular:     Rate and Rhythm: Normal rate and regular rhythm.     Heart sounds: Normal heart sounds.  Pulmonary:     Effort: Pulmonary effort is normal.     Breath sounds: No decreased air movement. Wheezing present. No decreased breath sounds, rhonchi or rales.  Lymphadenopathy:     Head:     Right side of head: No submental, submandibular or preauricular adenopathy.     Left side of head: No submental, submandibular or preauricular adenopathy.     Cervical:     Right cervical: No superficial cervical adenopathy.    Left cervical: No superficial cervical adenopathy.     Upper Body:     Right upper body: No supraclavicular adenopathy.     Left upper body: No supraclavicular adenopathy.  Neurological:     Mental Status: She is alert.  Psychiatric:        Behavior: Behavior is cooperative.      UC Treatments / Results  Labs (all labs ordered are listed, but only abnormal results are displayed) Labs Reviewed - No data to display  EKG   Radiology No results found.  Procedures Procedures (including critical care time)  Medications Ordered in UC Medications - No data to display  Initial Impression / Assessment and Plan / UC Course  I have reviewed the triage vital signs and the nursing notes.  Pertinent labs & imaging results that were available during my care of the patient were reviewed by me and considered in my medical decision making (see chart for details).      Final Clinical Impressions(s) / UC Diagnoses   Final diagnoses:  Acute bacterial sinusitis   Acute, new concern Patient presents today with concerns for nasal congestion, rhinorrhea, sore throat, ear fullness, fatigue, body aches for the past 7 days that is not improving with home measures At this time I suspect likely bacterial sinusitis.  Will start course of Augmentin p.o. twice daily x 7 days.  There was mild wheezing noted  globally so we will send in a prednisone burst to assist with this.  Recommend starting a daytime multisymptom medication such as DayQuil in addition to the  NyQuil.  Also recommend Flonase to assist with nasal congestion and ear fullness.  ED and return precautions reviewed and provided in after visit summary.  Follow-up as needed for progressing or persistent symptoms      Discharge Instructions      Based on your symptoms and duration of illness, I believe you may have a bacterial sinus infection  These typically resolve with antibiotic therapy along with at-home comfort measures  Today I have sent in a prescription for Augmentin 875-125 mg to be taken by mouth twice per day for 7 days FINISH THE ENTIRE COURSE unless you develop an allergic reaction or are instructed to discontinue.  It can take a few days for the antibiotic to kick in so I recommend symptomatic relief with over the counter medication such as the following:  Dayquil/ Nyquil Theraflu Alkaseltzer  Coricidin - if you have high blood pressure even if it is well managed with medications You can continue the Claritin and I recommend adding Flonase as well to help with the nasal congestion and ear fullness   These medications typically have Tylenol in them already do not need to supplement with more outside of those medications  Stay well hydrated with at least 75 oz of water per day to help with recovery  If you notice any of the following please go to the ED: increased fever not responding to Tylenol or Ibuprofen, swelling around your nose or eyes, difficulty seeing, shortness of breath or trouble breathing.       ED Prescriptions     Medication Sig Dispense Auth. Provider   amoxicillin-clavulanate (AUGMENTIN) 875-125 MG tablet Take 1 tablet by mouth every 12 (twelve) hours. 14 tablet Mayvis Agudelo E, PA-C   predniSONE (DELTASONE) 20 MG tablet Take 2 tablets (40 mg total) by mouth daily for 5 days. 10 tablet Coty Larsh,  Lj Miyamoto E, PA-C      PDMP not reviewed this encounter.   Providence Crosby, PA-C 05/24/23 1610

## 2023-05-24 NOTE — ED Triage Notes (Signed)
Pt states fever,body aches,chills.runny nose,and vomiting for the past 7days. States she has been taking Claritin and Nyquil at home for her symptoms.

## 2023-10-19 ENCOUNTER — Ambulatory Visit: Payer: Self-pay | Admitting: Urgent Care

## 2023-10-19 ENCOUNTER — Ambulatory Visit
Admission: EM | Admit: 2023-10-19 | Discharge: 2023-10-19 | Disposition: A | Discharge status: Home / Self Care | Attending: Family Medicine | Admitting: Family Medicine

## 2023-10-19 ENCOUNTER — Ambulatory Visit (INDEPENDENT_AMBULATORY_CARE_PROVIDER_SITE_OTHER)

## 2023-10-19 DIAGNOSIS — M25552 Pain in left hip: Secondary | ICD-10-CM | POA: Diagnosis not present

## 2023-10-19 DIAGNOSIS — M1612 Unilateral primary osteoarthritis, left hip: Secondary | ICD-10-CM

## 2023-10-19 DIAGNOSIS — M160 Bilateral primary osteoarthritis of hip: Secondary | ICD-10-CM | POA: Diagnosis not present

## 2023-10-19 DIAGNOSIS — S7002XA Contusion of left hip, initial encounter: Secondary | ICD-10-CM

## 2023-10-19 MED ORDER — IBUPROFEN 800 MG PO TABS: ORAL_TABLET | ORAL | 0 refills | Status: AC

## 2023-10-19 MED ORDER — CYCLOBENZAPRINE HCL 5 MG PO TABS: 5.0000 mg | ORAL_TABLET | Freq: Every evening | ORAL | 0 refills | Status: AC | PRN

## 2023-10-19 NOTE — ED Triage Notes (Signed)
 Pt states she fell two weeks ago.  C/O pain to left hip and lower back. States she has not been taking anything at home for the pain.

## 2023-10-19 NOTE — ED Provider Notes (Signed)
 [facility]  Note:  This document was prepared using Conservation officer, historic buildings and may include unintentional dictation errors.  MRN: [id number] DOB: June 17, 1960  Subjective:   Pamela Roberts is a 63 y.o. female presenting for 2-week history of persistent left-sided hip pain.  Patient reports that she accidentally fell into her car while she was cleaning her.  Has since continued to have moderate to severe left hip pain, left hip joint pain, lateral left buttock pain.  Has not taken anything for the pain.  Is wondering if she broke or dislocated her hip.  No current facility-administered medications for this encounter.  Current Outpatient Medications:    amoxicillin -clavulanate (AUGMENTIN ) 875-125 MG tablet, Take 1 tablet by mouth every 12 (twelve) hours., Disp: 14 tablet, Rfl: 0   HYDROcodone  bit-homatropine (HYCODAN) 5-1.5 MG/5ML syrup, Take 5 mLs by mouth every 6 (six) hours as needed for cough., Disp: 90 mL, Rfl: 0   levothyroxine (SYNTHROID, LEVOTHROID) 112 MCG tablet, Take by mouth., Disp: , Rfl:    omeprazole  (PRILOSEC) 40 MG capsule, Take 1 capsule (40 mg total) by mouth 2 (two) times daily., Disp: 60 capsule, Rfl: 1   Allergies  Allergen Reactions   Other Other (See Comments)    blisters    Past Medical History:  Diagnosis Date   Hypothyroidism    SOB (shortness of breath)      Past Surgical History:  Procedure Laterality Date   ABDOMINAL HYSTERECTOMY     CESAREAN SECTION W/BTL     ECTOPIC PREGNANCY SURGERY      Family History  Problem Relation Age of Onset   Asthma Brother    Heart disease Mother    Hypertension Mother    Lung cancer Father        was a smoker   Hypertension Sister    Diabetes Brother     Social History   Tobacco Use   Smoking status: Never   Smokeless tobacco: Never  Substance Use Topics   Alcohol use: No   Drug use: No    ROS   Objective:   Vitals: BP 129/68 (BP Location: Right Arm)   Pulse  (!) 54   Temp 98.1 F (36.7 C) (Oral)   Resp 16   SpO2 99%   Physical Exam Constitutional:      General: She is not in acute distress.    Appearance: Normal appearance. She is well-developed. She is not ill-appearing, toxic-appearing or diaphoretic.  HENT:     Head: Normocephalic and atraumatic.     Nose: Nose normal.     Mouth/Throat:     Mouth: Mucous membranes are moist.  Eyes:     General: No scleral icterus.       Right eye: No discharge.        Left eye: No discharge.     Extraocular Movements: Extraocular movements intact.  Cardiovascular:     Rate and Rhythm: Normal rate.  Pulmonary:     Effort: Pulmonary effort is normal.  Musculoskeletal:     Left hip: Tenderness (over areas outlined) and bony tenderness present. No deformity, lacerations or crepitus. Normal range of motion.       Legs:  Skin:    General: Skin is warm and dry.  Neurological:     General: No focal deficit present.     Mental Status: She is alert and oriented to person, place, and time.  Psychiatric:        Mood and Affect: Mood  normal.        Behavior: Behavior normal.     Assessment and Plan :   PDMP not reviewed this encounter.  1. Contusion of left hip, initial encounter   2. Left hip pain   3. Osteoarthritis of left hip, unspecified osteoarthritis type    Patient is very wary of taking any medication due to the potential for side effects.  I discussed this at length and ultimately patient would like to do a trial of ibuprofen  and cyclobenzaprine .  Follow-up with an orthopedist.  X-ray over-read was pending at time of discharge, recommended follow up with only abnormal results. Otherwise will not call for negative over-read. Patient was in agreement.  Counseled patient on potential for adverse effects with medications prescribed/recommended today, ER and return-to-clinic precautions discussed, patient verbalized understanding.    Christopher Savannah, [place] 10/19/23 [phone]

## 2024-03-05 DIAGNOSIS — E1165 Type 2 diabetes mellitus with hyperglycemia: Secondary | ICD-10-CM | POA: Diagnosis not present

## 2024-03-05 DIAGNOSIS — E039 Hypothyroidism, unspecified: Secondary | ICD-10-CM | POA: Diagnosis not present

## 2024-03-13 DIAGNOSIS — E669 Obesity, unspecified: Secondary | ICD-10-CM | POA: Diagnosis not present

## 2024-03-13 DIAGNOSIS — E039 Hypothyroidism, unspecified: Secondary | ICD-10-CM | POA: Diagnosis not present

## 2024-03-13 DIAGNOSIS — E1165 Type 2 diabetes mellitus with hyperglycemia: Secondary | ICD-10-CM | POA: Diagnosis not present

## 2024-03-18 ENCOUNTER — Other Ambulatory Visit: Payer: Self-pay | Admitting: Endocrinology

## 2024-03-18 DIAGNOSIS — E010 Iodine-deficiency related diffuse (endemic) goiter: Secondary | ICD-10-CM

## 2024-03-31 ENCOUNTER — Ambulatory Visit
Admission: RE | Admit: 2024-03-31 | Discharge: 2024-03-31 | Disposition: A | Discharge status: Home / Self Care | Source: Ambulatory Visit | Attending: Endocrinology | Admitting: Endocrinology

## 2024-03-31 DIAGNOSIS — E010 Iodine-deficiency related diffuse (endemic) goiter: Secondary | ICD-10-CM

## 2024-03-31 DIAGNOSIS — E042 Nontoxic multinodular goiter: Secondary | ICD-10-CM | POA: Diagnosis not present
